# Patient Record
Sex: Female | Born: 1960
Health system: Southern US, Community
[De-identification: ages and names within clinical notes are randomized; demographics above are authoritative.]

## PROBLEM LIST (undated history)

## (undated) DIAGNOSIS — Z87442 Personal history of urinary calculi: Secondary | ICD-10-CM

## (undated) DIAGNOSIS — I5189 Other ill-defined heart diseases: Secondary | ICD-10-CM

## (undated) DIAGNOSIS — F419 Anxiety disorder, unspecified: Secondary | ICD-10-CM

## (undated) DIAGNOSIS — N200 Calculus of kidney: Secondary | ICD-10-CM

## (undated) DIAGNOSIS — G4730 Sleep apnea, unspecified: Secondary | ICD-10-CM

## (undated) DIAGNOSIS — G4733 Obstructive sleep apnea (adult) (pediatric): Secondary | ICD-10-CM

## (undated) DIAGNOSIS — G709 Myoneural disorder, unspecified: Secondary | ICD-10-CM

## (undated) DIAGNOSIS — I340 Nonrheumatic mitral (valve) insufficiency: Secondary | ICD-10-CM

## (undated) DIAGNOSIS — M1990 Unspecified osteoarthritis, unspecified site: Secondary | ICD-10-CM

## (undated) DIAGNOSIS — E079 Disorder of thyroid, unspecified: Secondary | ICD-10-CM

## (undated) DIAGNOSIS — R519 Headache, unspecified: Secondary | ICD-10-CM

## (undated) DIAGNOSIS — N1831 Chronic kidney disease, stage 3a: Secondary | ICD-10-CM

## (undated) DIAGNOSIS — K5792 Diverticulitis of intestine, part unspecified, without perforation or abscess without bleeding: Secondary | ICD-10-CM

## (undated) HISTORY — DX: Unspecified osteoarthritis, unspecified site: M19.90

## (undated) HISTORY — DX: Diverticulitis of intestine, part unspecified, without perforation or abscess without bleeding: K57.92

## (undated) HISTORY — PX: HYSTERECTOMY ABDOMINAL WITH SALPINGECTOMY: SHX6725

## (undated) HISTORY — PX: ABDOMINAL HYSTERECTOMY: SHX81

## (undated) HISTORY — DX: Anxiety disorder, unspecified: F41.9

---

## 1997-01-19 HISTORY — PX: ANAL FISSURE REPAIR: SHX2312

## 2003-12-06 ENCOUNTER — Ambulatory Visit: Payer: Self-pay | Admitting: Specialist

## 2004-01-15 ENCOUNTER — Ambulatory Visit: Payer: Self-pay | Admitting: Internal Medicine

## 2005-01-14 ENCOUNTER — Ambulatory Visit: Payer: Self-pay | Admitting: Internal Medicine

## 2005-08-13 ENCOUNTER — Ambulatory Visit: Payer: Self-pay | Admitting: Unknown Physician Specialty

## 2006-01-20 ENCOUNTER — Ambulatory Visit: Payer: Self-pay | Admitting: Internal Medicine

## 2007-09-07 ENCOUNTER — Ambulatory Visit: Payer: Self-pay | Admitting: Internal Medicine

## 2007-09-14 ENCOUNTER — Ambulatory Visit: Payer: Self-pay | Admitting: Internal Medicine

## 2008-03-16 ENCOUNTER — Ambulatory Visit: Payer: Self-pay | Admitting: Internal Medicine

## 2008-09-06 ENCOUNTER — Ambulatory Visit: Payer: Self-pay | Admitting: Internal Medicine

## 2008-10-29 ENCOUNTER — Ambulatory Visit: Payer: Self-pay | Admitting: Unknown Physician Specialty

## 2009-09-12 ENCOUNTER — Ambulatory Visit: Payer: Self-pay | Admitting: Internal Medicine

## 2010-10-08 ENCOUNTER — Ambulatory Visit: Payer: Self-pay | Admitting: Internal Medicine

## 2011-11-17 ENCOUNTER — Ambulatory Visit: Payer: Self-pay | Admitting: Family Medicine

## 2012-05-11 ENCOUNTER — Ambulatory Visit: Payer: Self-pay | Admitting: Physician Assistant

## 2012-05-31 ENCOUNTER — Ambulatory Visit: Payer: Self-pay | Admitting: General Surgery

## 2012-05-31 ENCOUNTER — Ambulatory Visit: Payer: Self-pay | Admitting: Surgery

## 2012-06-19 HISTORY — PX: BREAST BIOPSY: SHX20

## 2012-06-20 ENCOUNTER — Ambulatory Visit: Payer: Self-pay | Admitting: Surgery

## 2012-06-22 LAB — PATHOLOGY REPORT

## 2012-11-17 ENCOUNTER — Ambulatory Visit: Payer: Self-pay | Admitting: Internal Medicine

## 2012-12-08 ENCOUNTER — Emergency Department: Payer: Self-pay | Admitting: Emergency Medicine

## 2012-12-08 LAB — URINALYSIS, COMPLETE
Bilirubin,UR: NEGATIVE
Glucose,UR: NEGATIVE mg/dL (ref 0–75)
Leukocyte Esterase: NEGATIVE
Nitrite: NEGATIVE
Ph: 5 (ref 4.5–8.0)
Protein: 30
Squamous Epithelial: 5
WBC UR: 3 /HPF (ref 0–5)

## 2012-12-08 LAB — BASIC METABOLIC PANEL
Chloride: 104 mmol/L (ref 98–107)
Co2: 24 mmol/L (ref 21–32)
Creatinine: 1.16 mg/dL (ref 0.60–1.30)
EGFR (Non-African Amer.): 54 — ABNORMAL LOW
Osmolality: 282 (ref 275–301)

## 2012-12-08 LAB — CBC WITH DIFFERENTIAL/PLATELET
Basophil #: 0.1 10*3/uL (ref 0.0–0.1)
Basophil %: 0.7 %
Eosinophil #: 0.1 10*3/uL (ref 0.0–0.7)
Eosinophil %: 1.1 %
HCT: 34.2 % — ABNORMAL LOW (ref 35.0–47.0)
HGB: 12 g/dL (ref 12.0–16.0)
Lymphocyte #: 1 10*3/uL (ref 1.0–3.6)
Lymphocyte %: 12.7 %
MCH: 31.7 pg (ref 26.0–34.0)
MCHC: 35.1 g/dL (ref 32.0–36.0)
Monocyte %: 3.8 %
Neutrophil #: 6.3 10*3/uL (ref 1.4–6.5)
Platelet: 230 10*3/uL (ref 150–440)
RBC: 3.79 10*6/uL — ABNORMAL LOW (ref 3.80–5.20)
RDW: 12.5 % (ref 11.5–14.5)
WBC: 7.7 10*3/uL (ref 3.6–11.0)

## 2012-12-12 ENCOUNTER — Ambulatory Visit: Payer: Self-pay | Admitting: Urology

## 2012-12-12 DIAGNOSIS — R3989 Other symptoms and signs involving the genitourinary system: Secondary | ICD-10-CM | POA: Insufficient documentation

## 2012-12-12 DIAGNOSIS — N1330 Unspecified hydronephrosis: Secondary | ICD-10-CM | POA: Insufficient documentation

## 2012-12-12 DIAGNOSIS — N201 Calculus of ureter: Secondary | ICD-10-CM

## 2012-12-12 HISTORY — DX: Unspecified hydronephrosis: N13.30

## 2012-12-12 HISTORY — DX: Calculus of ureter: N20.1

## 2012-12-28 ENCOUNTER — Ambulatory Visit: Payer: Self-pay | Admitting: Urology

## 2013-11-20 ENCOUNTER — Ambulatory Visit: Payer: Self-pay | Admitting: Internal Medicine

## 2014-08-07 ENCOUNTER — Other Ambulatory Visit
Admission: RE | Admit: 2014-08-07 | Discharge: 2014-08-07 | Disposition: A | Discharge status: Home / Self Care | Payer: Self-pay | Source: Other Acute Inpatient Hospital | Attending: Nurse Practitioner | Admitting: Nurse Practitioner

## 2014-08-07 DIAGNOSIS — R197 Diarrhea, unspecified: Secondary | ICD-10-CM | POA: Insufficient documentation

## 2014-08-07 LAB — C DIFFICILE QUICK SCREEN W PCR REFLEX
C Diff antigen: NEGATIVE
C Diff interpretation: NEGATIVE
C Diff toxin: NEGATIVE

## 2015-02-06 ENCOUNTER — Other Ambulatory Visit: Payer: Self-pay | Admitting: Internal Medicine

## 2015-02-07 ENCOUNTER — Other Ambulatory Visit: Payer: Self-pay | Admitting: Family Medicine

## 2015-02-07 DIAGNOSIS — Z1231 Encounter for screening mammogram for malignant neoplasm of breast: Secondary | ICD-10-CM

## 2015-02-19 ENCOUNTER — Ambulatory Visit
Admission: RE | Admit: 2015-02-19 | Discharge: 2015-02-19 | Disposition: A | Discharge status: Home / Self Care | Payer: BLUE CROSS/BLUE SHIELD | Source: Ambulatory Visit | Attending: Family Medicine | Admitting: Family Medicine

## 2015-02-19 DIAGNOSIS — Z1231 Encounter for screening mammogram for malignant neoplasm of breast: Secondary | ICD-10-CM | POA: Insufficient documentation

## 2015-08-08 ENCOUNTER — Emergency Department
Admission: EM | Admit: 2015-08-08 | Discharge: 2015-08-08 | Disposition: A | Discharge status: Home / Self Care | Payer: BLUE CROSS/BLUE SHIELD | Attending: Emergency Medicine | Admitting: Emergency Medicine

## 2015-08-08 ENCOUNTER — Emergency Department: Payer: BLUE CROSS/BLUE SHIELD

## 2015-08-08 DIAGNOSIS — K5732 Diverticulitis of large intestine without perforation or abscess without bleeding: Secondary | ICD-10-CM | POA: Insufficient documentation

## 2015-08-08 DIAGNOSIS — R1031 Right lower quadrant pain: Secondary | ICD-10-CM | POA: Diagnosis present

## 2015-08-08 HISTORY — DX: Calculus of kidney: N20.0

## 2015-08-08 HISTORY — DX: Disorder of thyroid, unspecified: E07.9

## 2015-08-08 LAB — COMPREHENSIVE METABOLIC PANEL
ALBUMIN: 4.2 g/dL (ref 3.5–5.0)
ALT: 16 U/L (ref 14–54)
ANION GAP: 8 (ref 5–15)
AST: 23 U/L (ref 15–41)
Alkaline Phosphatase: 53 U/L (ref 38–126)
BILIRUBIN TOTAL: 0.8 mg/dL (ref 0.3–1.2)
BUN: 18 mg/dL (ref 6–20)
CALCIUM: 9.1 mg/dL (ref 8.9–10.3)
CO2: 29 mmol/L (ref 22–32)
CREATININE: 0.86 mg/dL (ref 0.44–1.00)
Chloride: 99 mmol/L — ABNORMAL LOW (ref 101–111)
GFR calc Af Amer: >60 mL/min (ref >60)
GFR calc non Af Amer: >60 mL/min (ref >60)
GLUCOSE: 106 mg/dL — AB (ref 65–99)
Potassium: 4 mmol/L (ref 3.5–5.1)
SODIUM: 136 mmol/L (ref 135–145)
TOTAL PROTEIN: 7.6 g/dL (ref 6.5–8.1)

## 2015-08-08 LAB — CBC
HCT: 36.2 % (ref 35.0–47.0)
Hemoglobin: 12.7 g/dL (ref 12.0–16.0)
MCH: 31.8 pg (ref 26.0–34.0)
MCHC: 35 g/dL (ref 32.0–36.0)
MCV: 90.9 fL (ref 80.0–100.0)
PLATELETS: 266 10*3/uL (ref 150–440)
RBC: 3.99 MIL/uL (ref 3.80–5.20)
RDW: 12.8 % (ref 11.5–14.5)
WBC: 12.8 10*3/uL — ABNORMAL HIGH (ref 3.6–11.0)

## 2015-08-08 LAB — URINALYSIS COMPLETE WITH MICROSCOPIC (ARMC ONLY)
BILIRUBIN URINE: NEGATIVE
GLUCOSE, UA: NEGATIVE mg/dL
HGB URINE DIPSTICK: NEGATIVE
KETONES UR: NEGATIVE mg/dL
Leukocytes, UA: NEGATIVE
NITRITE: NEGATIVE
Protein, ur: NEGATIVE mg/dL
SPECIFIC GRAVITY, URINE: 1.01 (ref 1.005–1.030)
pH: 7 (ref 5.0–8.0)

## 2015-08-08 LAB — LIPASE, BLOOD: Lipase: 19 U/L (ref 11–51)

## 2015-08-08 MED ORDER — CIPROFLOXACIN HCL 500 MG PO TABS
500.0000 mg | ORAL_TABLET | Freq: Two times a day (BID) | ORAL | Status: AC
Start: 2015-08-08 — End: 2015-08-18

## 2015-08-08 MED ORDER — IOPAMIDOL (ISOVUE-300) INJECTION 61%
100.0000 mL | Freq: Once | INTRAVENOUS | Status: AC | PRN
  Administered 2015-08-08: 100 mL via INTRAVENOUS
  Filled 2015-08-08: qty 100

## 2015-08-08 MED ORDER — METRONIDAZOLE 500 MG PO TABS: 500.0000 mg | ORAL_TABLET | Freq: Three times a day (TID) | ORAL | Status: DC

## 2015-08-08 MED ORDER — DIATRIZOATE MEGLUMINE & SODIUM 66-10 % PO SOLN
15.0000 mL | Freq: Once | ORAL | Status: AC
  Administered 2015-08-08: 15 mL via ORAL

## 2015-08-08 MED ORDER — TRAMADOL HCL 50 MG PO TABS: 50.0000 mg | ORAL_TABLET | Freq: Four times a day (QID) | ORAL | Status: AC | PRN

## 2015-08-08 NOTE — Discharge Instructions (Signed)
Diverticulitis °Diverticulitis is inflammation or infection of small pouches in your colon that form when you have a condition called diverticulosis. The pouches in your colon are called diverticula. Your colon, or large intestine, is where water is absorbed and stool is formed. °Complications of diverticulitis can include: °· Bleeding. °· Severe infection. °· Severe pain. °· Perforation of your colon. °· Obstruction of your colon. °CAUSES  °Diverticulitis is caused by bacteria. °Diverticulitis happens when stool becomes trapped in diverticula. This allows bacteria to grow in the diverticula, which can lead to inflammation and infection. °RISK FACTORS °People with diverticulosis are at risk for diverticulitis. Eating a diet that does not include enough fiber from fruits and vegetables may make diverticulitis more likely to develop. °SYMPTOMS  °Symptoms of diverticulitis may include: °· Abdominal pain and tenderness. The pain is normally located on the left side of the abdomen, but may occur in other areas. °· Fever and chills. °· Bloating. °· Cramping. °· Nausea. °· Vomiting. °· Constipation. °· Diarrhea. °· Blood in your stool. °DIAGNOSIS  °Your health care provider will ask you about your medical history and do a physical exam. You may need to have tests done because many medical conditions can cause the same symptoms as diverticulitis. Tests may include: °· Blood tests. °· Urine tests. °· Imaging tests of the abdomen, including X-rays and CT scans. °When your condition is under control, your health care provider may recommend that you have a colonoscopy. A colonoscopy can show how severe your diverticula are and whether something else is causing your symptoms. °TREATMENT  °Most cases of diverticulitis are mild and can be treated at home. Treatment may include: °· Taking over-the-counter pain medicines. °· Following a clear liquid diet. °· Taking antibiotic medicines by mouth for 7-10 days. °More severe cases may  be treated at a hospital. Treatment may include: °· Not eating or drinking. °· Taking prescription pain medicine. °· Receiving antibiotic medicines through an IV tube. °· Receiving fluids and nutrition through an IV tube. °· Surgery. °HOME CARE INSTRUCTIONS  °· Follow your health care provider's instructions carefully. °· Follow a full liquid diet or other diet as directed by your health care provider. After your symptoms improve, your health care provider may tell you to change your diet. He or she may recommend you eat a high-fiber diet. Fruits and vegetables are good sources of fiber. Fiber makes it easier to pass stool. °· Take fiber supplements or probiotics as directed by your health care provider. °· Only take medicines as directed by your health care provider. °· Keep all your follow-up appointments. °SEEK MEDICAL CARE IF:  °· Your pain does not improve. °· You have a hard time eating food. °· Your bowel movements do not return to normal. °SEEK IMMEDIATE MEDICAL CARE IF:  °· Your pain becomes worse. °· Your symptoms do not get better. °· Your symptoms suddenly get worse. °· You have a fever. °· You have repeated vomiting. °· You have bloody or black, tarry stools. °MAKE SURE YOU:  °· Understand these instructions. °· Will watch your condition. °· Will get help right away if you are not doing well or get worse. °  °This information is not intended to replace advice given to you by your health care provider. Make sure you discuss any questions you have with your health care provider. °  °Document Released: 10/15/2004 Document Revised: 01/10/2013 Document Reviewed: 11/30/2012 °Elsevier Interactive Patient Education ©2016 [employer] ° °

## 2015-08-08 NOTE — ED Notes (Signed)
MD at bedside. 

## 2015-08-08 NOTE — ED Notes (Signed)
Patient transported to CT 

## 2015-08-08 NOTE — ED Provider Notes (Signed)
[facility] Emergency Department Provider Note        Time seen: ----------------------------------------- 4:19 PM on 08/08/2015 -----------------------------------------    I have reviewed the triage vital signs and the nursing notes.   HISTORY  Chief Complaint Abdominal Pain    HPI Kathleen Sims is a 55 y.o. female who presents the ER for right lower quadrant pain that began this morning. She said some nausea and slight loose stools. She has not had this pain before. Nothing seems to make it better, laughing and moving makes it worse. She has had a history of kidney stones and this feels similar.   Past Medical History  Diagnosis Date  . Kidney stones   . Thyroid disease     There are no active problems to display for this patient.   Past Surgical History  Procedure Laterality Date  . Breast biopsy Left 06/2012    neg  . Hysterectomy abdominal with salpingectomy      Allergies Penicillins  Social History Social History  Substance Use Topics  . Smoking status: Never Smoker   . Smokeless tobacco: None  . Alcohol Use: Yes    Review of Systems Constitutional: Negative for fever. Cardiovascular: Negative for chest pain. Respiratory: Negative for shortness of breath. Gastrointestinal: Positive for abdominal pain, diarrhea Genitourinary: Negative for dysuria. Musculoskeletal: Negative for back pain. Skin: Negative for rash. Neurological: Negative for headaches, focal weakness or numbness.  10-point ROS otherwise negative.  ____________________________________________   PHYSICAL EXAM:  VITAL SIGNS: ED Triage Vitals  Enc Vitals Group     BP 08/08/15 1600 142/79 mmHg     Pulse Rate 08/08/15 1600 76     Resp 08/08/15 1600 18     Temp 08/08/15 1600 98.6 F (37 C)     Temp Source 08/08/15 1600 Oral     SpO2 08/08/15 1600 97 %     Weight 08/08/15 1600 130 lb (58.968 kg)     Height 08/08/15 1600 5\' 3"  (1.6 m)     Head Cir --      Peak Flow --      Pain Score 08/08/15 1600 4     Pain Loc --      Pain Edu? --      Excl. in GC? --     Constitutional: Alert and oriented. Well appearing and in no distress. Eyes: Conjunctivae are normal. PERRL. Normal extraocular movements. ENT   Head: Normocephalic and atraumatic.   Nose: No congestion/rhinnorhea.   Mouth/Throat: Mucous membranes are moist.   Neck: No stridor. Cardiovascular: Normal rate, regular rhythm. No murmurs, rubs, or gallops. Respiratory: Normal respiratory effort without tachypnea nor retractions. Breath sounds are clear and equal bilaterally. No wheezes/rales/rhonchi. Gastrointestinal: Soft, normal bowel sounds, right lower quadrant and McBurney's point tenderness. Also right mid quadrant tenderness Musculoskeletal: Nontender with normal range of motion in all extremities. No lower extremity tenderness nor edema. Neurologic:  Normal speech and language. No gross focal neurologic deficits are appreciated.  Skin:  Skin is warm, dry and intact. No rash noted. Psychiatric: Mood and affect are normal. Speech and behavior are normal.  ____________________________________________  ED COURSE:  Pertinent labs & imaging results that were available during my care of the patient were reviewed by me and considered in my medical decision making (see chart for details). Patient presents with right lower quadrant pain of uncertain etiology. She will need CT imaging for further evaluation. ____________________________________________    LABS (pertinent positives/negatives)  Labs Reviewed  COMPREHENSIVE  METABOLIC PANEL - Abnormal; Notable for the following:    Chloride 99 (*)    Glucose, Bld 106 (*)    All other components within normal limits  CBC - Abnormal; Notable for the following:    WBC 12.8 (*)    All other components within normal limits  URINALYSIS COMPLETEWITH MICROSCOPIC ([facility] ONLY) - Abnormal; Notable for the following:    Color, Urine  YELLOW (*)    APPearance CLEAR (*)    Bacteria, UA RARE (*)    Squamous Epithelial / LPF 0-5 (*)    All other components within normal limits  LIPASE, BLOOD    RADIOLOGY Images were viewed by me  CT of the abdomen and pelvis with contrast IMPRESSION: Focal wall thickening and stranding about a segment of the proximal transverse colon most compatible with acute diverticulitis. Colon cancer screening is recommended after the patient's acute episode has passed.  Punctate nonobstructing stone lower pole left kidney, unchanged. ____________________________________________  FINAL ASSESSMENT AND PLAN  Diverticulitis  Plan: Patient with labs and imaging as dictated above. Patient presents to ER with right-sided abdominal pain. Findings were consistent with acute diverticulitis. She'll be discharged with Cipro and Flagyl, given pain medicine and encouraged to have close follow-up with her doctor for recheck.   Emily Filbert, MD   Note: This dictation was prepared with Dragon dictation. Any transcriptional errors that result from this process are unintentional   Emily Filbert, MD 08/08/15 1714

## 2015-08-08 NOTE — ED Notes (Signed)
Pt arrives to ER via POV c/o RLQ pain that began this AM. Nausea and loose stools. Pt alert and oriented X4, active, cooperative, pt in NAD. RR even and unlabored, color WNL.

## 2015-09-10 ENCOUNTER — Other Ambulatory Visit: Payer: Self-pay | Admitting: Unknown Physician Specialty

## 2015-09-10 DIAGNOSIS — R519 Headache, unspecified: Secondary | ICD-10-CM

## 2015-09-10 DIAGNOSIS — R51 Headache: Principal | ICD-10-CM

## 2015-09-20 ENCOUNTER — Ambulatory Visit: Payer: BLUE CROSS/BLUE SHIELD

## 2016-02-18 ENCOUNTER — Other Ambulatory Visit: Payer: Self-pay | Admitting: Family Medicine

## 2016-02-18 DIAGNOSIS — Z1231 Encounter for screening mammogram for malignant neoplasm of breast: Secondary | ICD-10-CM

## 2016-03-16 ENCOUNTER — Ambulatory Visit
Admission: RE | Admit: 2016-03-16 | Discharge: 2016-03-16 | Disposition: A | Discharge status: Home / Self Care | Payer: BLUE CROSS/BLUE SHIELD | Source: Ambulatory Visit | Attending: Family Medicine | Admitting: Family Medicine

## 2016-03-16 DIAGNOSIS — Z1231 Encounter for screening mammogram for malignant neoplasm of breast: Secondary | ICD-10-CM | POA: Insufficient documentation

## 2017-01-25 ENCOUNTER — Other Ambulatory Visit: Payer: Self-pay | Admitting: Family Medicine

## 2017-01-25 DIAGNOSIS — Z1231 Encounter for screening mammogram for malignant neoplasm of breast: Secondary | ICD-10-CM

## 2017-03-17 ENCOUNTER — Ambulatory Visit
Admission: RE | Admit: 2017-03-17 | Discharge: 2017-03-17 | Disposition: A | Discharge status: Home / Self Care | Payer: BLUE CROSS/BLUE SHIELD | Source: Ambulatory Visit | Attending: Family Medicine | Admitting: Family Medicine

## 2017-03-17 DIAGNOSIS — Z1231 Encounter for screening mammogram for malignant neoplasm of breast: Secondary | ICD-10-CM | POA: Diagnosis present

## 2017-07-14 ENCOUNTER — Other Ambulatory Visit: Payer: Self-pay

## 2017-07-14 ENCOUNTER — Emergency Department: Payer: BLUE CROSS/BLUE SHIELD

## 2017-07-14 ENCOUNTER — Emergency Department
Admission: EM | Admit: 2017-07-14 | Discharge: 2017-07-14 | Disposition: A | Discharge status: Home / Self Care | Payer: BLUE CROSS/BLUE SHIELD | Attending: Emergency Medicine | Admitting: Emergency Medicine

## 2017-07-14 ENCOUNTER — Encounter: Payer: Self-pay | Admitting: Emergency Medicine

## 2017-07-14 DIAGNOSIS — N200 Calculus of kidney: Secondary | ICD-10-CM | POA: Diagnosis not present

## 2017-07-14 DIAGNOSIS — R109 Unspecified abdominal pain: Secondary | ICD-10-CM

## 2017-07-14 DIAGNOSIS — Z79899 Other long term (current) drug therapy: Secondary | ICD-10-CM | POA: Diagnosis not present

## 2017-07-14 DIAGNOSIS — N202 Calculus of kidney with calculus of ureter: Secondary | ICD-10-CM | POA: Diagnosis not present

## 2017-07-14 DIAGNOSIS — N23 Unspecified renal colic: Secondary | ICD-10-CM

## 2017-07-14 LAB — URINALYSIS, ROUTINE W REFLEX MICROSCOPIC
Bacteria, UA: NONE SEEN
Bilirubin Urine: NEGATIVE
Glucose, UA: NEGATIVE mg/dL
KETONES UR: NEGATIVE mg/dL
Leukocytes, UA: NEGATIVE
Nitrite: NEGATIVE
PH: 6 (ref 5.0–8.0)
Protein, ur: NEGATIVE mg/dL
Specific Gravity, Urine: 1.017 (ref 1.005–1.030)

## 2017-07-14 LAB — COMPREHENSIVE METABOLIC PANEL
ALK PHOS: 48 U/L (ref 38–126)
ALT: 19 U/L (ref 0–44)
AST: 29 U/L (ref 15–41)
Albumin: 3.8 g/dL (ref 3.5–5.0)
Anion gap: 10 (ref 5–15)
BILIRUBIN TOTAL: 0.5 mg/dL (ref 0.3–1.2)
BUN: 28 mg/dL — AB (ref 6–20)
CO2: 23 mmol/L (ref 22–32)
Calcium: 8.9 mg/dL (ref 8.9–10.3)
Chloride: 106 mmol/L (ref 98–111)
Creatinine, Ser: 1.01 mg/dL — ABNORMAL HIGH (ref 0.44–1.00)
GLUCOSE: 166 mg/dL — AB (ref 70–99)
Potassium: 3.6 mmol/L (ref 3.5–5.1)
Sodium: 139 mmol/L (ref 135–145)
TOTAL PROTEIN: 6.7 g/dL (ref 6.5–8.1)

## 2017-07-14 MED ORDER — ONDANSETRON HCL 4 MG/2ML IJ SOLN
INTRAMUSCULAR | Status: AC
  Filled 2017-07-14: qty 2

## 2017-07-14 MED ORDER — KETOROLAC TROMETHAMINE 30 MG/ML IJ SOLN
15.0000 mg | INTRAMUSCULAR | Status: AC
  Administered 2017-07-14: 15 mg via INTRAVENOUS

## 2017-07-14 MED ORDER — OXYCODONE HCL 5 MG PO TABS: 5.0000 mg | ORAL_TABLET | Freq: Four times a day (QID) | ORAL | 0 refills | Status: DC | PRN

## 2017-07-14 MED ORDER — KETOROLAC TROMETHAMINE 30 MG/ML IJ SOLN
INTRAMUSCULAR | Status: AC
  Filled 2017-07-14: qty 1

## 2017-07-14 MED ORDER — NAPROXEN 500 MG PO TABS: 500.0000 mg | ORAL_TABLET | Freq: Two times a day (BID) | ORAL | 0 refills | Status: DC

## 2017-07-14 MED ORDER — ONDANSETRON HCL 4 MG/2ML IJ SOLN
4.0000 mg | Freq: Once | INTRAMUSCULAR | Status: AC
  Administered 2017-07-14: 4 mg via INTRAVENOUS

## 2017-07-14 MED ORDER — ONDANSETRON 4 MG PO TBDP: 4.0000 mg | ORAL_TABLET | Freq: Three times a day (TID) | ORAL | 0 refills | Status: DC | PRN

## 2017-07-14 MED ORDER — SODIUM CHLORIDE 0.9 % IV BOLUS
1000.0000 mL | Freq: Once | INTRAVENOUS | Status: AC
Start: 2017-07-14 — End: 2017-07-14
  Administered 2017-07-14: 1000 mL via INTRAVENOUS

## 2017-07-14 MED ORDER — TAMSULOSIN HCL 0.4 MG PO CAPS: 0.4000 mg | ORAL_CAPSULE | Freq: Every day | ORAL | 0 refills | Status: DC

## 2017-07-14 MED ORDER — MORPHINE SULFATE (PF) 4 MG/ML IV SOLN
INTRAVENOUS | Status: AC
  Filled 2017-07-14: qty 1

## 2017-07-14 MED ORDER — MORPHINE SULFATE (PF) 4 MG/ML IV SOLN
4.0000 mg | Freq: Once | INTRAVENOUS | Status: AC
  Administered 2017-07-14: 4 mg via INTRAVENOUS

## 2017-07-14 NOTE — ED Notes (Signed)
Pt ambulatory to POV without difficulty. VSS. NAD. Discharge instructions, RX and follow up reviewed. All questions answered.  

## 2017-07-14 NOTE — ED Provider Notes (Addendum)
[facility] Emergency Department Provider Note  ____________________________________________  Time seen: Approximately 6:42 AM  I have reviewed the triage vital signs and the nursing notes.   HISTORY  Chief Complaint Flank Pain and Emesis    HPI Kathleen Sims is a 57 y.o. female who had sudden onset of right flank pain radiating to right lower quadrant at 5 AM today.  Constant, waxing waning, sharp.  Feels like a kidney stone like she has had in the past on her left side.  She vomited one time.  No fevers or chills.  No diarrhea.  No constipation.      Past Medical History:  Diagnosis Date  . Kidney stones   . Thyroid disease      There are no active problems to display for this patient.    Past Surgical History:  Procedure Laterality Date  . BREAST BIOPSY Left 06/2012   neg  . HYSTERECTOMY ABDOMINAL WITH SALPINGECTOMY       Prior to Admission medications   Medication Sig Start Date End Date Taking? Authorizing Provider  metroNIDAZOLE (FLAGYL) 500 MG tablet Take 1 tablet (500 mg total) by mouth 3 (three) times daily. 08/08/15   Emily Filbert, MD  naproxen (NAPROSYN) 500 MG tablet Take 1 tablet (500 mg total) by mouth 2 (two) times daily with a meal. 07/14/17   Sharman Cheek, MD  ondansetron (ZOFRAN ODT) 4 MG disintegrating tablet Take 1 tablet (4 mg total) by mouth every 8 (eight) hours as needed for nausea or vomiting. 07/14/17   Sharman Cheek, MD  oxyCODONE (ROXICODONE) 5 MG immediate release tablet Take 1 tablet (5 mg total) by mouth every 6 (six) hours as needed for breakthrough pain. 07/14/17   Sharman Cheek, MD  tamsulosin (FLOMAX) 0.4 MG CAPS capsule Take 1 capsule (0.4 mg total) by mouth daily. 07/14/17   Sharman Cheek, MD     Allergies Penicillins   Family History  Problem Relation Age of Onset  . Breast cancer Maternal Aunt        great Mat. 29's    Social History Social History   Tobacco Use  . Smoking  status: Never Smoker  . Smokeless tobacco: Never Used  Substance Use Topics  . Alcohol use: Yes  . Drug use: Never    Review of Systems  Constitutional:   No fever or chills.  ENT:   No sore throat. No rhinorrhea. Cardiovascular:   No chest pain or syncope. Respiratory:   No dyspnea or cough. Gastrointestinal:   Positive as above right flank pain and vomiting. Musculoskeletal:   Negative for focal pain or swelling All other systems reviewed and are negative except as documented above in ROS and HPI.  ____________________________________________   PHYSICAL EXAM:  VITAL SIGNS: ED Triage Vitals  Enc Vitals Group     BP 07/14/17 0548 130/82     Pulse Rate 07/14/17 0548 (!) 59     Resp 07/14/17 0548 18     Temp 07/14/17 0548 98 F (36.7 C)     Temp Source 07/14/17 0548 Oral     SpO2 07/14/17 0548 100 %     Weight 07/14/17 0549 128 lb (58.1 kg)     Height 07/14/17 0549 5\' 3"  (1.6 m)     Head Circumference --      Peak Flow --      Pain Score 07/14/17 0549 10     Pain Loc --      Pain Edu? --  Excl. in GC? --     Vital signs reviewed, nursing assessments reviewed.   Constitutional:   Alert and oriented. Non-toxic appearance. Eyes:   Conjunctivae are normal. EOMI. PERRL. ENT      Head:   Normocephalic and atraumatic.      Nose:   No congestion/rhinnorhea.       Mouth/Throat:   MMM, no pharyngeal erythema. No peritonsillar mass.       Neck:   No meningismus. Full ROM. Hematological/Lymphatic/Immunilogical:   No cervical lymphadenopathy. Cardiovascular:   RRR. Symmetric bilateral radial and DP pulses.  No murmurs.  Respiratory:   Normal respiratory effort without tachypnea/retractions. Breath sounds are clear and equal bilaterally. No wheezes/rales/rhonchi. Gastrointestinal:   Soft with right lower quadrant tenderness . Non distended. There is no CVA tenderness.  No rebound, rigidity, or guarding.  Musculoskeletal:   Normal range of motion in all extremities. No  joint effusions.  No lower extremity tenderness.  No edema. Neurologic:   Normal speech and language.  Motor grossly intact. No acute focal neurologic deficits are appreciated.  Skin:    Skin is warm, dry and intact. No rash noted.  No petechiae, purpura, or bullae.  ____________________________________________    LABS (pertinent positives/negatives) (all labs ordered are listed, but only abnormal results are displayed) Labs Reviewed  COMPREHENSIVE METABOLIC PANEL - Abnormal; Notable for the following components:      Result Value   Glucose, Bld 166 (*)    BUN 28 (*)    Creatinine, Ser 1.01 (*)    All other components within normal limits  URINALYSIS, ROUTINE W REFLEX MICROSCOPIC   ____________________________________________   EKG    ____________________________________________    RADIOLOGY  Ct Renal Stone Study  Result Date: 07/14/2017 CLINICAL DATA:  Right flank pain EXAM: CT ABDOMEN AND PELVIS WITHOUT CONTRAST TECHNIQUE: Multidetector CT imaging of the abdomen and pelvis was performed following the standard protocol without IV contrast. COMPARISON:  08/08/2015 FINDINGS: Lower chest: Minimal dependent atelectasis. Hepatobiliary: Unremarkable Pancreas: Unremarkable Spleen: Unremarkable Adrenals/Urinary Tract: Adrenal glands are unremarkable. Mild right hydronephrosis. 4 mm calculus at the right ureteropelvic junction. No renal calculus. No evidence of left urinary calculus. Small benign-appearing cyst in the lower pole of the left kidney is stable. Decompressed bladder. Stomach/Bowel: Normal appendix. Moderate stool burden throughout the colon. No obvious mass in the colon. No evidence of small-bowel obstruction. Stomach is decompressed. Vascular/Lymphatic: No evidence of aortic aneurysm. No obvious abnormal retroperitoneal adenopathy. Reproductive: Uterus is absent.  Adnexa are unremarkable. Other: No free fluid. Musculoskeletal: No vertebral compression deformity. Advanced  L5-S1 degenerative disc disease with vacuum. Mild L4-5 and L5-S1 facet arthropathy. IMPRESSION: 4 mm right ureteropelvic junction calculus is associated with mild right hydronephrosis. Electronically Signed   By: Jolaine ClickArthur  Hoss M.D.   On: 07/14/2017 07:36    ____________________________________________   PROCEDURES Procedures  ____________________________________________  DIFFERENTIAL DIAGNOSIS   Ureteral colic, appendicitis, ovarian cyst  CLINICAL IMPRESSION / ASSESSMENT AND PLAN / ED COURSE  Pertinent labs & imaging results that were available during my care of the patient were reviewed by me and considered in my medical decision making (see chart for details).    Patient uncomfortable but not in distress.  Vital signs unremarkable.  Presents with right flank pain, most likely ureteral colic, but given the location I will obtain a CT scan for further evaluation.  Check chemistry panel and urinalysis.  For symptom control, patient was given Zofran 4 mg IV, morphine 4 mill grams IV, Toradol 15 mg  IV for her severe pain and nausea.  I ordered 1 L IV fluid bolus for hydration.   ----------------------------------------- 7:20 AM on 07/14/2017 -----------------------------------------  CT scan shows a 4 mm kidney stone on the right proximal ureter with mild to moderate hydronephrosis.  We will follow-up on urinalysis and plan for discharge home.  Add Flomax.  Symptoms  remain controlled.     ____________________________________________   FINAL CLINICAL IMPRESSION(S) / ED DIAGNOSES    Final diagnoses:  Ureteral colic  Flank pain  Kidney stone     ED Discharge Orders        Ordered    naproxen (NAPROSYN) 500 MG tablet  2 times daily with meals     07/14/17 0720    oxyCODONE (ROXICODONE) 5 MG immediate release tablet  Every 6 hours PRN     07/14/17 0720    ondansetron (ZOFRAN ODT) 4 MG disintegrating tablet  Every 8 hours PRN     07/14/17 0720    tamsulosin (FLOMAX) 0.4 MG  CAPS capsule  Daily     07/14/17 0750      Portions of this note were generated with dragon dictation software. Dictation errors may occur despite best attempts at proofreading.    Sharman Cheek, MD 07/14/17 [id number]    Sharman Cheek, MD 07/14/17 [phone]

## 2017-07-14 NOTE — ED Triage Notes (Signed)
Pt arrived to the ED accompanied by her husband for complaints of right side flank pain. Pt states that she had kidney stones in the past and that this feels just like it. Pt is AOx4 in moderate pain distress.

## 2017-07-14 NOTE — Discharge Instructions (Signed)
Your CT scan shows a 3mm kidney stone on your right side.  It is just outside the kidney, so it may take a few days for the stone to pass before your symptoms resolve.  Take naproxen twice a day for the next 5 days. Take zofran as needed for nausea, and oxycodone when needed for severe pain.

## 2018-02-15 ENCOUNTER — Other Ambulatory Visit: Payer: Self-pay | Admitting: Family Medicine

## 2018-02-15 ENCOUNTER — Encounter: Payer: Self-pay | Admitting: General Practice

## 2018-02-15 DIAGNOSIS — Z1231 Encounter for screening mammogram for malignant neoplasm of breast: Secondary | ICD-10-CM

## 2018-03-18 ENCOUNTER — Ambulatory Visit
Admission: RE | Admit: 2018-03-18 | Discharge: 2018-03-18 | Disposition: A | Discharge status: Home / Self Care | Payer: BLUE CROSS/BLUE SHIELD | Source: Ambulatory Visit | Attending: Family Medicine | Admitting: Family Medicine

## 2018-03-18 DIAGNOSIS — Z1231 Encounter for screening mammogram for malignant neoplasm of breast: Secondary | ICD-10-CM | POA: Insufficient documentation

## 2018-09-01 DIAGNOSIS — D2339 Other benign neoplasm of skin of other parts of face: Secondary | ICD-10-CM | POA: Diagnosis not present

## 2018-09-01 DIAGNOSIS — L821 Other seborrheic keratosis: Secondary | ICD-10-CM | POA: Diagnosis not present

## 2018-09-01 DIAGNOSIS — L738 Other specified follicular disorders: Secondary | ICD-10-CM | POA: Diagnosis not present

## 2018-11-29 DIAGNOSIS — R1030 Lower abdominal pain, unspecified: Secondary | ICD-10-CM | POA: Diagnosis not present

## 2018-11-29 DIAGNOSIS — R197 Diarrhea, unspecified: Secondary | ICD-10-CM | POA: Diagnosis not present

## 2018-11-29 DIAGNOSIS — K625 Hemorrhage of anus and rectum: Secondary | ICD-10-CM | POA: Diagnosis not present

## 2018-11-29 DIAGNOSIS — Z86010 Personal history of colonic polyps: Secondary | ICD-10-CM | POA: Diagnosis not present

## 2019-02-02 ENCOUNTER — Other Ambulatory Visit: Payer: Self-pay | Admitting: Family Medicine

## 2019-02-02 DIAGNOSIS — Z1231 Encounter for screening mammogram for malignant neoplasm of breast: Secondary | ICD-10-CM

## 2019-02-10 DIAGNOSIS — J00 Acute nasopharyngitis [common cold]: Secondary | ICD-10-CM | POA: Diagnosis not present

## 2019-02-13 ENCOUNTER — Other Ambulatory Visit: Payer: Self-pay | Admitting: Family Medicine

## 2019-02-13 DIAGNOSIS — N6320 Unspecified lump in the left breast, unspecified quadrant: Secondary | ICD-10-CM

## 2019-02-14 ENCOUNTER — Encounter: Payer: Self-pay | Admitting: General Practice

## 2019-02-14 DIAGNOSIS — Z0189 Encounter for other specified special examinations: Secondary | ICD-10-CM | POA: Diagnosis not present

## 2019-02-15 DIAGNOSIS — Z043 Encounter for examination and observation following other accident: Secondary | ICD-10-CM | POA: Diagnosis not present

## 2019-02-15 DIAGNOSIS — Z713 Dietary counseling and surveillance: Secondary | ICD-10-CM | POA: Diagnosis not present

## 2019-02-23 DIAGNOSIS — J069 Acute upper respiratory infection, unspecified: Secondary | ICD-10-CM | POA: Diagnosis not present

## 2019-02-28 ENCOUNTER — Other Ambulatory Visit: Payer: BLUE CROSS/BLUE SHIELD

## 2019-03-06 ENCOUNTER — Other Ambulatory Visit: Payer: BLUE CROSS/BLUE SHIELD

## 2019-03-14 ENCOUNTER — Ambulatory Visit
Admission: RE | Admit: 2019-03-14 | Discharge: 2019-03-14 | Disposition: A | Discharge status: Home / Self Care | Payer: BC Managed Care – PPO | Source: Ambulatory Visit | Attending: Family Medicine | Admitting: Family Medicine

## 2019-03-14 DIAGNOSIS — R922 Inconclusive mammogram: Secondary | ICD-10-CM | POA: Diagnosis not present

## 2019-03-14 DIAGNOSIS — N6489 Other specified disorders of breast: Secondary | ICD-10-CM | POA: Diagnosis not present

## 2019-03-14 DIAGNOSIS — N6320 Unspecified lump in the left breast, unspecified quadrant: Secondary | ICD-10-CM | POA: Insufficient documentation

## 2019-04-04 DIAGNOSIS — Z01812 Encounter for preprocedural laboratory examination: Secondary | ICD-10-CM | POA: Diagnosis not present

## 2019-04-07 DIAGNOSIS — Z86010 Personal history of colonic polyps: Secondary | ICD-10-CM | POA: Diagnosis not present

## 2019-04-07 DIAGNOSIS — K640 First degree hemorrhoids: Secondary | ICD-10-CM | POA: Diagnosis not present

## 2019-04-07 DIAGNOSIS — K5730 Diverticulosis of large intestine without perforation or abscess without bleeding: Secondary | ICD-10-CM | POA: Diagnosis not present

## 2019-04-18 DIAGNOSIS — N951 Menopausal and female climacteric states: Secondary | ICD-10-CM | POA: Diagnosis not present

## 2019-04-24 DIAGNOSIS — N951 Menopausal and female climacteric states: Secondary | ICD-10-CM | POA: Diagnosis not present

## 2019-05-26 ENCOUNTER — Other Ambulatory Visit: Payer: Self-pay

## 2019-05-30 ENCOUNTER — Ambulatory Visit: Payer: BC Managed Care – PPO | Admitting: Nurse Practitioner

## 2019-05-30 ENCOUNTER — Other Ambulatory Visit: Payer: Self-pay

## 2019-05-30 ENCOUNTER — Encounter: Payer: Self-pay | Admitting: Nurse Practitioner

## 2019-05-30 VITALS — BP 118/80 | HR 66 | Temp 97.1°F | Ht 63.0 in | Wt 128.8 lb

## 2019-05-30 DIAGNOSIS — E039 Hypothyroidism, unspecified: Secondary | ICD-10-CM | POA: Diagnosis not present

## 2019-05-30 DIAGNOSIS — N200 Calculus of kidney: Secondary | ICD-10-CM

## 2019-05-30 DIAGNOSIS — Z8719 Personal history of other diseases of the digestive system: Secondary | ICD-10-CM

## 2019-05-30 DIAGNOSIS — R232 Flushing: Secondary | ICD-10-CM | POA: Insufficient documentation

## 2019-05-30 DIAGNOSIS — Z0000 Encounter for general adult medical examination without abnormal findings: Secondary | ICD-10-CM

## 2019-05-30 DIAGNOSIS — F4323 Adjustment disorder with mixed anxiety and depressed mood: Secondary | ICD-10-CM

## 2019-05-30 DIAGNOSIS — K5900 Constipation, unspecified: Secondary | ICD-10-CM

## 2019-05-30 DIAGNOSIS — K582 Mixed irritable bowel syndrome: Secondary | ICD-10-CM

## 2019-05-30 DIAGNOSIS — Z87442 Personal history of urinary calculi: Secondary | ICD-10-CM

## 2019-05-30 DIAGNOSIS — Z86010 Personal history of colonic polyps: Secondary | ICD-10-CM

## 2019-05-30 DIAGNOSIS — E785 Hyperlipidemia, unspecified: Secondary | ICD-10-CM

## 2019-05-30 DIAGNOSIS — M47812 Spondylosis without myelopathy or radiculopathy, cervical region: Secondary | ICD-10-CM

## 2019-05-30 DIAGNOSIS — Z87448 Personal history of other diseases of urinary system: Secondary | ICD-10-CM

## 2019-05-30 HISTORY — DX: Hypothyroidism, unspecified: E03.9

## 2019-05-30 NOTE — Patient Instructions (Addendum)
It was really nice to see you again today.  I have given you laboratory orders to get fasting blood work.  Continue work with constipation with MiraLAX and stool softener.  I placed a GYN referral in for you to discuss rashes and estrogen titration and options.  You have elevated cholesterol and LDL panels.  Continue to work on diet and exercise.  I will check fasting labs now.  We can repeat again in 3 months and see where you.

## 2019-05-30 NOTE — Progress Notes (Signed)
New Patient Office Visit  Subjective:  Patient ID: Kathleen Sims, female    DOB: Nov 10, 1960  Age: 59 y.o. MRN: [id number]  CC:  Chief Complaint  Patient presents with  . New Patient (Initial Visit)    establish care    HPI Kathleen Sims is a 59 year old who presents to establish care with primary care provider. She had a rough year and had Covid at the end of Jan and she has  had both vaccines. She had the loss of her sister and mother this year. She reports strong family support and she is handling the grief well enough.   Hot flashes: She would like help with hot flashes. She had a total hysterectomy in 1999. She was taking premarin until last year. She stopped it and had terrible hot flashes -10 times a day and wakes her up at night . She is on a tiny dose of estrogen at 0.5 mg and wants to know if there are other options for her. She has no sex drive and would like help with this.    Hypothyroidism: On Synthroid 125 mg daily. No concerns.  Neck arthritis- On Mobic 15 mg and gets HA with if she misses more than 3 days. Tylenol does not help. No neck stretching  exercises.   IBS with constipation predominant currently: On Miralax and stool softeners. Does not like to take the Miralax often as it makes small stools. She has not tried Linzess. She is followed by [facility]. Recent colonoscopy .   Hx of nephrolithiasis and hydronephrosis.  She has a prescription for Flomax and oxycodone to take if she develops another kidney stone. Last time she needed this medication was about 2 years ago.   She drinks plenty of fluids and stays well-hydrated and has done well.    Immunizations: Covid 04/25/2019 and 05/16/2019 Diet:healthy Exercise: yes Colonoscopy:UTD and due in 5 years Dexa: 2011 She is on calcium 1000 mg per day with vit D  No other additional D 3 now as she gets in the sun.  Pap Smear: Complete hysterectomy Mammogram: 03/14/2019  Wine x 1 on weekend. Non smoker.   Past Medical  History:  Diagnosis Date  . Arthritis   . Diverticulitis   . History of diverticulitis 05/31/2019  . History of hydronephrosis 05/31/2019  . History of nephrolithiasis 05/31/2019  . Hypothyroid 05/30/2019  . IBS (irritable bowel syndrome) 05/31/2019  . Kidney stones   . Nephrolithiasis 05/31/2019  . Thyroid disease     Past Surgical History:  Procedure Laterality Date  . ABDOMINAL HYSTERECTOMY    . ANAL FISSURE REPAIR  1999   with sphincterotomy  . BREAST BIOPSY Left 06/2012   neg  . HYSTERECTOMY ABDOMINAL WITH SALPINGECTOMY      Family History  Problem Relation Age of Onset  . Breast cancer Maternal Aunt        great Mat. 80's  . Arthritis Mother   . Diabetes Mother   . Hearing loss Mother   . Hyperlipidemia Mother   . Hypertension Mother   . Stroke Sister   . Parkinson's disease Sister   . Hypertension Sister   . Early death Sister   . Diabetes Sister   . Heart disease Brother   . Cystic fibrosis Brother   . Arthritis Sister   . Asthma Sister   . Depression Sister   . Hyperlipidemia Sister   . Hypertension Sister   . Hypertension Sister   . Hyperlipidemia  Sister   . Arthritis Sister   . Arthritis Sister   . Depression Sister   . Hypertension Sister   . Multiple sclerosis Sister     Social History   Socioeconomic History  . Marital status: Married    Spouse name: Not on file  . Number of children: Not on file  . Years of education: Not on file  . Highest education level: Not on file  Occupational History  . Not on file  Tobacco Use  . Smoking status: Never Smoker  . Smokeless tobacco: Never Used  Substance and Sexual Activity  . Alcohol use: Yes  . Drug use: Never  . Sexual activity: Yes  Other Topics Concern  . Not on file  Social History Narrative  . Not on file   Social Determinants of Health   Financial Resource Strain:   . Difficulty of Paying Living Expenses:   Food Insecurity:   . Worried About Charity fundraiser in the Last Year:     . Arboriculturist in the Last Year:   Transportation Needs:   . Film/video editor (Medical):   Marland Kitchen Lack of Transportation (Non-Medical):   Physical Activity:   . Days of Exercise per Week:   . Minutes of Exercise per Session:   Stress:   . Feeling of Stress :   Social Connections:   . Frequency of Communication with Friends and Family:   . Frequency of Social Gatherings with Friends and Family:   . Attends Religious Services:   . Active Member of Clubs or Organizations:   . Attends Archivist Meetings:   Marland Kitchen Marital Status:   Intimate Partner Violence:   . Fear of Current or Ex-Partner:   . Emotionally Abused:   Marland Kitchen Physically Abused:   . Sexually Abused:     ROS Review of Systems  Constitutional: Positive for fatigue.       Stress and tired,  Back to normal from Covid.   HENT: Negative for congestion and sore throat.   Eyes: Negative.   Respiratory: Negative for cough and shortness of breath.   Cardiovascular: Negative for chest pain, palpitations and leg swelling.  Gastrointestinal: Positive for constipation.  Genitourinary: Negative for difficulty urinating, dysuria, frequency and hematuria.  Skin: Negative for rash.  Neurological: Negative for dizziness and light-headedness.  Hematological: Negative for adenopathy. Does not bruise/bleed easily.  Psychiatric/Behavioral:       Recent grief and loss- has access to employee health behavioral health. She declines as doing well enough.     Objective:   Today's Vitals: BP 118/80 (BP Location: Left Arm, Patient Position: Sitting, Cuff Size: Small)   Pulse 66   Temp (!) 97.1 F (36.2 C) (Skin)   Ht 5' 3" (1.6 m)   Wt 128 lb 12.8 oz (58.4 kg)   SpO2 96%   BMI 22.82 kg/m   Physical Exam Vitals reviewed.  Constitutional:      Appearance: Normal appearance. She is normal weight.  HENT:     Head: Normocephalic and atraumatic.  Eyes:     Conjunctiva/sclera: Conjunctivae normal.     Pupils: Pupils are equal,  round, and reactive to light.  Cardiovascular:     Rate and Rhythm: Normal rate and regular rhythm.     Pulses: Normal pulses.     Heart sounds: Normal heart sounds.  Pulmonary:     Effort: Pulmonary effort is normal.     Breath sounds: Normal breath sounds.  Abdominal:  General: Abdomen is flat.     Palpations: Abdomen is soft.     Tenderness: There is no abdominal tenderness.  Musculoskeletal:        General: No swelling. Normal range of motion.     Cervical back: Normal range of motion and neck supple.  Skin:    General: Skin is warm and dry.  Neurological:     General: No focal deficit present.     Mental Status: She is alert and oriented to person, place, and time.  Psychiatric:        Mood and Affect: Mood normal.        Behavior: Behavior normal.        Thought Content: Thought content normal.        Judgment: Judgment normal.     Assessment & Plan:   Problem List Items Addressed This Visit      Cardiovascular and Mediastinum   Hot flashes - Primary   Relevant Orders   Ambulatory referral to Obstetrics / Gynecology     Digestive   IBS (irritable bowel syndrome)   Relevant Medications   polyethylene glycol powder (GLYCOLAX/MIRALAX) 17 GM/SCOOP powder     Endocrine   Hypothyroid   Relevant Medications   levothyroxine (SYNTHROID) 125 MCG tablet   Other Relevant Orders   TSH     Genitourinary   RESOLVED: Nephrolithiasis     Other   History of nephrolithiasis   History of hydronephrosis   Hx of adenomatous colonic polyps   History of diverticulitis    Other Visit Diagnoses    Adjustment reaction with anxiety and depression       Constipation, unspecified constipation type       Relevant Orders   CBC with Differential/Platelet   Hyperlipidemia, unspecified hyperlipidemia type       Relevant Orders   Comp Met (CMET)   Lipid Profile   Encounter for medical examination to establish care          Outpatient Encounter Medications as of 05/30/2019    Medication Sig  . Cholecalciferol 50 MCG (2000 UT) CAPS Take by mouth.  . cyclobenzaprine (FLEXERIL) 10 MG tablet cyclobenzaprine 10 mg tablet  TAKE 1 TABLET BY MOUTH 3 TIMES A DAY  . estradiol (ESTRACE) 0.5 MG tablet Take 0.5 mg by mouth daily.  Marland Kitchen levothyroxine (SYNTHROID) 125 MCG tablet Synthroid 125 mcg tablet  TAKE 1 TABLET (125 MCG) BY ORAL ROUTE ONCE DAILY  . Melatonin 5 MG SUBL Place under the tongue.  . meloxicam (MOBIC) 15 MG tablet Take by mouth.  . ondansetron (ZOFRAN ODT) 4 MG disintegrating tablet Take 1 tablet (4 mg total) by mouth every 8 (eight) hours as needed for nausea or vomiting.  Marland Kitchen oxyCODONE (ROXICODONE) 5 MG immediate release tablet Take 1 tablet (5 mg total) by mouth every 6 (six) hours as needed for breakthrough pain.  . polyethylene glycol powder (GLYCOLAX/MIRALAX) 17 GM/SCOOP powder polyethylene glycol 3350 17 gram/dose oral powder  MIX AND DISSOLVE IN FLUID AND DRINK AT ONCE AS DIRECTED  . tamsulosin (FLOMAX) 0.4 MG CAPS capsule Take 1 capsule (0.4 mg total) by mouth daily.  . [DISCONTINUED] metroNIDAZOLE (FLAGYL) 500 MG tablet Take 1 tablet (500 mg total) by mouth 3 (three) times daily.  . [DISCONTINUED] naproxen (NAPROSYN) 500 MG tablet Take 1 tablet (500 mg total) by mouth 2 (two) times daily with a meal.   No facility-administered encounter medications on file as of 05/30/2019.   I have given you laboratory orders  to get fasting blood work.  Continue work with constipation with MiraLAX and stool softener.  I placed a GYN referral in for you to discuss rashes and estrogen titration and options.  You have elevated cholesterol and LDL panels.  Continue to work on diet and exercise.  I will check fasting labs now.  We can repeat again in 3 months and see where you.   Follow-up: Return in about 3 months (around 08/30/2019).  This visit occurred during the SARS-CoV-2 public health emergency.  Safety protocols were in place, including screening questions prior to  the visit, additional usage of staff PPE, and extensive cleaning of exam room while observing appropriate contact time as indicated for disinfecting solutions.   Denice Paradise, NP

## 2019-05-31 ENCOUNTER — Encounter: Payer: Self-pay | Admitting: Nurse Practitioner

## 2019-05-31 DIAGNOSIS — Z87442 Personal history of urinary calculi: Secondary | ICD-10-CM | POA: Insufficient documentation

## 2019-05-31 DIAGNOSIS — Z87448 Personal history of other diseases of urinary system: Secondary | ICD-10-CM | POA: Insufficient documentation

## 2019-05-31 DIAGNOSIS — K589 Irritable bowel syndrome without diarrhea: Secondary | ICD-10-CM

## 2019-05-31 DIAGNOSIS — M47812 Spondylosis without myelopathy or radiculopathy, cervical region: Secondary | ICD-10-CM

## 2019-05-31 DIAGNOSIS — N200 Calculus of kidney: Secondary | ICD-10-CM

## 2019-05-31 DIAGNOSIS — Z8719 Personal history of other diseases of the digestive system: Secondary | ICD-10-CM | POA: Insufficient documentation

## 2019-05-31 DIAGNOSIS — Z86010 Personal history of colonic polyps: Secondary | ICD-10-CM | POA: Insufficient documentation

## 2019-05-31 HISTORY — DX: Personal history of other diseases of urinary system: Z87.448

## 2019-05-31 HISTORY — DX: Calculus of kidney: N20.0

## 2019-05-31 HISTORY — DX: Personal history of urinary calculi: Z87.442

## 2019-05-31 HISTORY — DX: Personal history of other diseases of the digestive system: Z87.19

## 2019-05-31 HISTORY — DX: Irritable bowel syndrome, unspecified: K58.9

## 2019-05-31 HISTORY — DX: Spondylosis without myelopathy or radiculopathy, cervical region: M47.812

## 2019-06-25 ENCOUNTER — Telehealth: Payer: Self-pay | Admitting: Nurse Practitioner

## 2019-06-25 NOTE — Telephone Encounter (Signed)
I have given her laboratory orders to get fasting blood work. She is a no show for labs ordered in May.  Please call her and advise that she get these labs done at her convenience.  I wanted them as scheduled is fasting.  Thank you

## 2019-06-27 ENCOUNTER — Encounter: Payer: Self-pay | Admitting: Obstetrics and Gynecology

## 2019-06-27 ENCOUNTER — Telehealth: Payer: Self-pay | Admitting: Nurse Practitioner

## 2019-06-27 ENCOUNTER — Other Ambulatory Visit: Payer: Self-pay

## 2019-06-27 ENCOUNTER — Ambulatory Visit: Payer: BC Managed Care – PPO | Admitting: Obstetrics and Gynecology

## 2019-06-27 VITALS — BP 145/82 | HR 63 | Ht 63.0 in | Wt 129.3 lb

## 2019-06-27 DIAGNOSIS — R030 Elevated blood-pressure reading, without diagnosis of hypertension: Secondary | ICD-10-CM

## 2019-06-27 DIAGNOSIS — Z9229 Personal history of other drug therapy: Secondary | ICD-10-CM | POA: Diagnosis not present

## 2019-06-27 DIAGNOSIS — N951 Menopausal and female climacteric states: Secondary | ICD-10-CM

## 2019-06-27 DIAGNOSIS — M1711 Unilateral primary osteoarthritis, right knee: Secondary | ICD-10-CM | POA: Diagnosis not present

## 2019-06-27 MED ORDER — EST ESTROGENS-METHYLTEST 0.625-1.25 MG PO TABS: 1.0000 | ORAL_TABLET | Freq: Every day | ORAL | 3 refills | Status: DC

## 2019-06-27 NOTE — Telephone Encounter (Signed)
Pt returned call and stated that she will schedule labs in July

## 2019-06-27 NOTE — Telephone Encounter (Signed)
LMTCB to schedule fasting lab appt

## 2019-06-27 NOTE — Progress Notes (Signed)
GYNECOLOGY PROGRESS NOTE  Subjective:    Patient ID: Kathleen Sims, female    DOB: [birth year], 59 y.o.   MRN: [id number]  HPI  Patient is a 59 y.o. G0P0000 female who presents as a referral from her PCP [facility]) due to complaints of menopausal vasomotor symptoms.  She has a history of HRT use (Premarin tablets) for ~ 20 years (initiated after her hysterectomy).  Patient states that last year, her insurance changed and would no longer cover her medication.  Patient opted to discontinue use at that time, and was able to go ~ 8 months when her vasomotor symptoms intensified.  She was seen by her PCP ~ 4 months ago, who initiated her on Estrace (generic) which was covered by her insurance, however she states that she has not experienced much relief.  She is still having hot flushes and night sweats during the night (at least 2-3 times each night), as well as flashes during the day.  Was advised to seek care with a GYN for further discussion of management options.   Of note, she is also noting some issues with decreased libido.  Notes that her husband uses testosterone supplementation, and has somewhat of an increased sex drive.  Hot flahses (up 2-3 times per night).     Gynecologic History:  Last pap smear:  Last mammogram: 03/14/2019  Past Medical History:  Diagnosis Date  . Arthritis   . Diverticulitis   . History of diverticulitis 05/31/2019  . History of hydronephrosis 05/31/2019  . History of nephrolithiasis 05/31/2019  . Hypothyroid 05/30/2019  . IBS (irritable bowel syndrome) 05/31/2019  . Kidney stones   . Neck arthritis 05/31/2019  . Nephrolithiasis 05/31/2019  . Thyroid disease     Family History  Problem Relation Age of Onset  . Breast cancer Maternal Aunt        great Mat. 80's  . Arthritis Mother   . Diabetes Mother   . Hearing loss Mother   . Hyperlipidemia Mother   . Hypertension Mother   . Stroke Sister   . Parkinson's disease Sister   . Hypertension  Sister   . Early death Sister   . Diabetes Sister   . Heart disease Brother   . Cystic fibrosis Brother   . Arthritis Sister   . Asthma Sister   . Depression Sister   . Hyperlipidemia Sister   . Hypertension Sister   . Hypertension Sister   . Hyperlipidemia Sister   . Arthritis Sister   . Arthritis Sister   . Depression Sister   . Hypertension Sister   . Multiple sclerosis Sister     OB History  Gravida Para Term Preterm AB Living  0 0 0 0 0 0  SAB TAB Ectopic Multiple Live Births  0 0 0 0 0    Social History   Socioeconomic History  . Marital status: Married    Spouse name: Not on file  . Number of children: Not on file  . Years of education: Not on file  . Highest education level: Not on file  Occupational History  . Not on file  Tobacco Use  . Smoking status: Never Smoker  . Smokeless tobacco: Never Used  Vaping Use  . Vaping Use: Never used  Substance and Sexual Activity  . Alcohol use: Yes  . Drug use: Never  . Sexual activity: Not Currently  Other Topics Concern  . Not on file  Social History Narrative  . Not  on file   Social Determinants of Health   Financial Resource Strain:   . Difficulty of Paying Living Expenses:   Food Insecurity:   . Worried About Programme researcher, broadcasting/film/video in the Last Year:   . Barista in the Last Year:   Transportation Needs:   . Freight forwarder (Medical):   Marland Kitchen Lack of Transportation (Non-Medical):   Physical Activity:   . Days of Exercise per Week:   . Minutes of Exercise per Session:   Stress:   . Feeling of Stress :   Social Connections:   . Frequency of Communication with Friends and Family:   . Frequency of Social Gatherings with Friends and Family:   . Attends Religious Services:   . Active Member of Clubs or Organizations:   . Attends Banker Meetings:   Marland Kitchen Marital Status:   Intimate Partner Violence:   . Fear of Current or Ex-Partner:   . Emotionally Abused:   Marland Kitchen Physically Abused:     . Sexually Abused:     Current Outpatient Medications on File Prior to Visit  Medication Sig Dispense Refill  . Cholecalciferol 50 MCG (2000 UT) CAPS Take by mouth.    . levothyroxine (SYNTHROID) 125 MCG tablet Synthroid 125 mcg tablet  TAKE 1 TABLET (125 MCG) BY ORAL ROUTE ONCE DAILY    . Melatonin 5 MG SUBL Place under the tongue.    . meloxicam (MOBIC) 15 MG tablet Take by mouth.    . ondansetron (ZOFRAN ODT) 4 MG disintegrating tablet Take 1 tablet (4 mg total) by mouth every 8 (eight) hours as needed for nausea or vomiting. 20 tablet 0  . oxyCODONE (ROXICODONE) 5 MG immediate release tablet Take 1 tablet (5 mg total) by mouth every 6 (six) hours as needed for breakthrough pain. 12 tablet 0  . polyethylene glycol powder (GLYCOLAX/MIRALAX) 17 GM/SCOOP powder polyethylene glycol 3350 17 gram/dose oral powder  MIX AND DISSOLVE IN FLUID AND DRINK AT ONCE AS DIRECTED    . Probiotic Product (PROBIOTIC DAILY PO) Take by mouth.    . tamsulosin (FLOMAX) 0.4 MG CAPS capsule Take 1 capsule (0.4 mg total) by mouth daily. 30 capsule 0   No current facility-administered medications on file prior to visit.    Allergies  Allergen Reactions  . Amoxicillin     Other reaction(s): Unknown  . Lansoprazole     Other reaction(s): Other (See Comments) Intolerance  . Penicillins      Review of Systems Pertinent items noted in HPI and remainder of comprehensive ROS otherwise negative.   Objective:   Blood pressure (!) 145/82, pulse 63, height 5\' 3"  (1.6 m), weight 129 lb 4.8 oz (58.7 kg). General appearance: alert and no distress Remainder of exam deferred.   Assessment:   Menopausal vasomotory symptoms History of HRT use Elevated blood pressure  Plan:   - Discussion had with patient regarding symptoms and options. Will check with insurance company to assess which medications are covered by insurance.  Can prescribe Estratest.  - Patient is aware of her long-term use of hormonal therapy  and risks associated with it.  Also discussed other non-hormonal options that could help her vasomotor symptoms (however may not have an affect on her sex drive). Desires to resume HRT.  - Patient with elevated BP today, no prior h/o HTN. Advised to monitor BPs at future visits.  - Patient to f/u in 1-2 months if symptoms do not improve.  Rubie Maid, MD [facility]

## 2019-06-27 NOTE — Progress Notes (Signed)
New Pt present for referral for hot flashes and night sweat. Pt stated that she was taking medication for hot flashes and stopped during the pandemic.

## 2019-06-27 NOTE — Patient Instructions (Signed)
Menopause and Hormone Replacement Therapy Menopause is a normal time of life when menstrual periods stop completely and the ovaries stop producing the female hormones estrogen and progesterone. This lack of hormones can affect your health and cause undesirable symptoms. Hormone replacement therapy (HRT) can relieve some of those symptoms. What is hormone replacement therapy? HRT is the use of artificial (synthetic) hormones to replace hormones that your body has stopped producing because you have reached menopause. What are my options for HRT?  HRT may consist of the synthetic hormones estrogen and progestin, or it may consist of only estrogen (estrogen-only therapy). You and your health care provider will decide which form of HRT is best for you. If you choose to be on HRT and you have a uterus, estrogen and progestin are usually prescribed. Estrogen-only therapy is used for women who do not have a uterus. Possible options for taking HRT include:  Pills.  Patches.  Gels.  Sprays.  Vaginal cream.  Vaginal rings.  Vaginal inserts. The amount of hormone(s) that you take and how long you take the hormone(s) varies according to your health. It is important to:  Begin HRT with the lowest possible dosage.  Stop HRT as soon as your health care provider tells you to stop.  Work with your health care provider so that you feel informed and comfortable with your decisions. What are the benefits of HRT? HRT can reduce the frequency and severity of menopausal symptoms. Benefits of HRT vary according to the kind of symptoms that you have, how severe they are, and your overall health. HRT may help to improve the following symptoms of menopause:  Hot flashes and night sweats. These are sudden feelings of heat that spread over the face and body. The skin may turn red, like a blush. Night sweats are hot flashes that happen while you are sleeping or trying to sleep.  Bone loss (osteoporosis). The  body loses calcium more quickly after menopause, causing the bones to become weaker. This can increase the risk for bone breaks (fractures).  Vaginal dryness. The lining of the vagina can become thin and dry, which can cause pain during sex or cause infection, burning, or itching.  Urinary tract infections.  Urinary incontinence. This is the inability to control when you pass urine.  Irritability.  Short-term memory problems. What are the risks of HRT? Risks of HRT vary depending on your individual health and medical history. Risks of HRT also depend on whether you receive both estrogen and progestin or you receive estrogen only. HRT may increase the risk of:  Spotting. This is when a small amount of blood leaks from the vagina unexpectedly.  Endometrial cancer. This cancer is in the lining of the uterus (endometrium).  Breast cancer.  Increased density of breast tissue. This can make it harder to find breast cancer on a breast X-ray (mammogram).  Stroke.  Heart disease.  Blood clots.  Gallbladder disease.  Liver disease. Risks of HRT can increase if you have any of the following conditions:  Endometrial cancer.  Liver disease.  Heart disease.  Breast cancer.  History of blood clots.  History of stroke. Follow these instructions at home:  Take over-the-counter and prescription medicines only as told by your health care provider.  Get mammograms, pelvic exams, and medical checkups as often as told by your health care provider.  Have Pap tests done as often as told by your health care provider. A Pap test is sometimes called a Pap smear. It   is a screening test that is used to check for signs of cancer of the cervix and vagina. A Pap test can also identify the presence of infection or precancerous changes. Pap tests may be done: ? Every 3 years, starting at age 21. ? Every 5 years, starting after age 30, in combination with testing for human papillomavirus  (HPV). ? More often or less often depending on other medical conditions you have, your age, and other risk factors.  It is up to you to get the results of your Pap test. Ask your health care provider, or the department that is doing the test, when your results will be ready.  Keep all follow-up visits as told by your health care provider. This is important. Contact a health care provider if you have:  Pain or swelling in your legs.  Shortness of breath.  Chest pain.  Lumps or changes in your breasts or armpits.  Slurred speech.  Pain, burning, or bleeding when you urinate.  Unusual vaginal bleeding.  Dizziness or headaches.  Weakness or numbness in any part of your arms or legs.  Pain in your abdomen. Summary  Menopause is a normal time of life when menstrual periods stop completely and the ovaries stop producing the female hormones estrogen and progesterone.  Hormone replacement therapy (HRT) can relieve some of the symptoms of menopause.  HRT can reduce the frequency and severity of menopausal symptoms.  Risks of HRT vary depending on your individual health and medical history. This information is not intended to replace advice given to you by your health care provider. Make sure you discuss any questions you have with your health care provider. Document Revised: 09/07/2017 Document Reviewed: 09/07/2017 Elsevier Patient Education  2020 Elsevier Inc.  

## 2019-06-27 NOTE — Telephone Encounter (Signed)
Spoke with patient and she will have labs drawn next month at [facility]

## 2019-06-27 NOTE — Telephone Encounter (Signed)
Spoke with patient and she will have her labs done at [facility] in july

## 2019-08-22 DIAGNOSIS — Z0189 Encounter for other specified special examinations: Secondary | ICD-10-CM | POA: Diagnosis not present

## 2019-08-22 LAB — HEPATIC FUNCTION PANEL
ALT: 14 (ref 7–35)
AST: 20 (ref 13–35)
Alkaline Phosphatase: 72 (ref 25–125)
Bilirubin, Total: 0.5

## 2019-08-22 LAB — CBC AND DIFFERENTIAL
HCT: 39 (ref 36–46)
Hemoglobin: 13.1 (ref 12.0–16.0)
Neutrophils Absolute: 3
Platelets: 294 (ref 150–399)
WBC: 5

## 2019-08-22 LAB — CBC: RBC: 4.19 (ref 3.87–5.11)

## 2019-08-22 LAB — LIPID PANEL
Cholesterol: 245 — AB (ref 0–200)
HDL: 71 — AB (ref 35–70)
LDL Cholesterol: 164
Triglycerides: 61 (ref 40–160)

## 2019-08-22 LAB — BASIC METABOLIC PANEL
BUN: 26 — AB (ref 4–21)
CO2: 26 — AB (ref 13–22)
Chloride: 102 (ref 99–108)
Creatinine: 1.1 (ref <=1.1)
Glucose: 86
Potassium: 4.6 (ref 3.4–5.3)
Sodium: 140 (ref 137–147)

## 2019-08-22 LAB — COMPREHENSIVE METABOLIC PANEL
Albumin: 4.6 (ref 3.5–5.0)
Calcium: 9.5 (ref 8.7–10.7)
GFR calc non Af Amer: 52
Globulin: 2.3

## 2019-08-22 LAB — TSH: TSH: 0.94 (ref <=5.90)

## 2019-08-24 ENCOUNTER — Telehealth: Payer: Self-pay | Admitting: Nurse Practitioner

## 2019-08-24 NOTE — Telephone Encounter (Addendum)
I reviewed her lab work obtained 08/22/2019 and as she does have an elevated creatinine 1.14, normal BUN 26, CO2 26 with normal range 13-22, cholesterol 245 LDL 164, normal thyroid, liver function, electrolytes, urine CBC, glucose.  PLAN: 1. Ask her to hydrate well and we can repeat Met B at her  f/up office visit.   2.  Continue with healthy low-fat diet, exercise.

## 2019-08-25 NOTE — Telephone Encounter (Signed)
Patient notified and voiced understanding to results and will stay hydrated and see you on the 11Th.

## 2019-08-28 ENCOUNTER — Other Ambulatory Visit: Payer: Self-pay

## 2019-08-30 ENCOUNTER — Other Ambulatory Visit: Payer: Self-pay

## 2019-08-30 ENCOUNTER — Encounter: Payer: Self-pay | Admitting: Nurse Practitioner

## 2019-08-30 ENCOUNTER — Telehealth: Payer: Self-pay | Admitting: Nurse Practitioner

## 2019-08-30 ENCOUNTER — Encounter: Payer: BC Managed Care – PPO | Admitting: Nurse Practitioner

## 2019-08-30 NOTE — Progress Notes (Signed)
Erroneous visit

## 2019-08-30 NOTE — Telephone Encounter (Signed)
She came in today for a follow up visit to recheck her BUN. She gets her labs drawn at work. She has no concerns.   She had to leave for an appt and could not wait for the provider. I spoke with Kathleen Sims on the telephone.  She reports she is active in spin class and gets dehydrated. Her BUN fluctuates with hydration status- for years. She will hydrate well and her pattern is the BUN returns to normal. She has normal GFR and creatinine. Hx of Kidney stones and she says she is due to f/up with Dr.  Evelene Croon. She reports she will contact his office. She has no kidney stone symptoms now.   She is working on her lipids- healthy lifestyle. She is going through menopause and thinks her lipids will improve. She  declines starting on a statin. Reports repeat CPE labs are due in Jan and her lipids will be rechecked. She will forward me a copy and make a follow-up visit in January.

## 2019-09-04 DIAGNOSIS — Z1152 Encounter for screening for COVID-19: Secondary | ICD-10-CM | POA: Diagnosis not present

## 2019-09-11 ENCOUNTER — Encounter: Payer: Self-pay | Admitting: Nurse Practitioner

## 2019-09-11 DIAGNOSIS — R310 Gross hematuria: Secondary | ICD-10-CM | POA: Diagnosis not present

## 2019-09-11 DIAGNOSIS — N23 Unspecified renal colic: Secondary | ICD-10-CM | POA: Diagnosis not present

## 2019-09-11 DIAGNOSIS — Z0189 Encounter for other specified special examinations: Secondary | ICD-10-CM | POA: Diagnosis not present

## 2019-09-11 DIAGNOSIS — N200 Calculus of kidney: Secondary | ICD-10-CM | POA: Diagnosis not present

## 2019-09-20 DIAGNOSIS — N200 Calculus of kidney: Secondary | ICD-10-CM | POA: Diagnosis not present

## 2019-09-21 DIAGNOSIS — N390 Urinary tract infection, site not specified: Secondary | ICD-10-CM | POA: Diagnosis not present

## 2019-09-21 DIAGNOSIS — R3129 Other microscopic hematuria: Secondary | ICD-10-CM | POA: Diagnosis not present

## 2019-09-21 DIAGNOSIS — N23 Unspecified renal colic: Secondary | ICD-10-CM | POA: Diagnosis not present

## 2019-09-27 ENCOUNTER — Other Ambulatory Visit: Payer: Self-pay

## 2019-09-27 ENCOUNTER — Other Ambulatory Visit
Admission: RE | Admit: 2019-09-27 | Discharge: 2019-09-27 | Disposition: A | Discharge status: Home / Self Care | Payer: BC Managed Care – PPO | Source: Ambulatory Visit | Attending: Urology | Admitting: Urology

## 2019-09-27 DIAGNOSIS — Z20822 Contact with and (suspected) exposure to covid-19: Secondary | ICD-10-CM | POA: Diagnosis not present

## 2019-09-27 DIAGNOSIS — Z01812 Encounter for preprocedural laboratory examination: Secondary | ICD-10-CM | POA: Diagnosis not present

## 2019-09-27 DIAGNOSIS — R3129 Other microscopic hematuria: Secondary | ICD-10-CM | POA: Diagnosis not present

## 2019-09-27 DIAGNOSIS — N201 Calculus of ureter: Secondary | ICD-10-CM | POA: Diagnosis not present

## 2019-09-27 DIAGNOSIS — R310 Gross hematuria: Secondary | ICD-10-CM | POA: Diagnosis not present

## 2019-09-27 LAB — SARS CORONAVIRUS 2 (TAT 6-24 HRS): SARS Coronavirus 2: NEGATIVE

## 2019-09-27 NOTE — H&P (Signed)
NAME: Kathleen Sims, Kathleen Sims MEDICAL RECORD KZ:[id number] ACCOUNT [id number] DATE OF BIRTH:Mar 14, 1960 FACILITY: [facility] LOCATION: [facility] PHYSICIAN:Zalika Tieszen Gilles Chiquito, MD  HISTORY AND PHYSICAL  DATE OF ADMISSION:  09/28/2019  CHIEF COMPLAINT:  Left flank pain.  HISTORY OF PRESENT ILLNESS:  The patient is a 59 year old Caucasian female with a 67-month history of intermittent left flank pain.  She has had 1 episode of hematuria since that time.  She was found to have probable distal left ureteral calculus with  slight hydronephrosis on IVP.  She comes in now for left retrograde with left ureteroscopic ureterolithotomy with holmium laser lithotripsy.  She does have a past history of calcium oxalate stone disease.  ALLERGIES:  PENICILLIN.  CURRENT MEDICATIONS:  Synthroid, meloxicam, calcium, stool softeners, probiotics, omega 3 fish oil, Nucynta, Zofran and tamsulosin.  PAST SURGICAL HISTORY: 1.  Hysterectomy 2000. 2.  Anal fissure repair in 1996.  PAST AND CURRENT MEDICAL HISTORY: 1.  Hypothyroidism. 2.  Arthritis of the cervical spine. 3.  Irritable bowel syndrome. 4.  Kidney stones.  REVIEW OF SYSTEMS:  The patient denies heart disease, diabetes, stroke or hypertension.  She denied chest pain.  SOCIAL HISTORY:  The patient denied tobacco use.  She consumes alcohol rarely.  FAMILY HISTORY:  Father died at age 48 of heart disease.  Mother died at age 36 with diabetes and hypercholesterolemia.  The patient has a sister who died with Parkinson disease.  There is no family history of urological disease.  PHYSICAL EXAMINATION: VITAL SIGNS:  Height 5 feet, weight 129 pounds. GENERAL:  Well-nourished, white female in no acute distress. HEENT:  Sclerae were clear.  Pupils are equally round, reactive to light and accommodation.  Extraocular movements are intact. NECK:  No palpable cervical adenopathy. PULMONARY:  Lungs clear to auscultation. CARDIOVASCULAR:  Regular rhythm and rate without  audible murmurs. ABDOMEN:  Soft, nontender abdomen. GENITOURINARY AND RECTAL:  Deferred. NEUROMUSCULAR:  Alert and oriented x3.  IMPRESSION: 1.  Left ureterolithiasis with renal colic. 2.  Hematuria.  PLAN:  Left retrograde with left ureteroscopic ureterolithotomy and holmium laser lithotripsy with possible stent placement.  VN/NUANCE  D:09/27/2019 T:09/27/2019 JOB:[id number]

## 2019-09-28 ENCOUNTER — Ambulatory Visit: Payer: BC Managed Care – PPO | Admitting: Anesthesiology

## 2019-09-28 ENCOUNTER — Other Ambulatory Visit: Payer: Self-pay

## 2019-09-28 ENCOUNTER — Ambulatory Visit: Payer: BC Managed Care – PPO

## 2019-09-28 ENCOUNTER — Ambulatory Visit
Admission: RE | Admit: 2019-09-28 | Discharge: 2019-09-28 | Disposition: A | Discharge status: Home / Self Care | Payer: BC Managed Care – PPO | Attending: Urology | Admitting: Urology

## 2019-09-28 ENCOUNTER — Encounter
Admission: RE | Disposition: A | Discharge status: Home / Self Care | Payer: Self-pay | Source: Home / Self Care | Attending: Urology

## 2019-09-28 ENCOUNTER — Encounter: Payer: Self-pay | Admitting: Urology

## 2019-09-28 DIAGNOSIS — Z820 Family history of epilepsy and other diseases of the nervous system: Secondary | ICD-10-CM | POA: Diagnosis not present

## 2019-09-28 DIAGNOSIS — K589 Irritable bowel syndrome without diarrhea: Secondary | ICD-10-CM | POA: Insufficient documentation

## 2019-09-28 DIAGNOSIS — E039 Hypothyroidism, unspecified: Secondary | ICD-10-CM | POA: Diagnosis not present

## 2019-09-28 DIAGNOSIS — Z833 Family history of diabetes mellitus: Secondary | ICD-10-CM | POA: Insufficient documentation

## 2019-09-28 DIAGNOSIS — Z8249 Family history of ischemic heart disease and other diseases of the circulatory system: Secondary | ICD-10-CM | POA: Insufficient documentation

## 2019-09-28 DIAGNOSIS — Z8349 Family history of other endocrine, nutritional and metabolic diseases: Secondary | ICD-10-CM | POA: Insufficient documentation

## 2019-09-28 DIAGNOSIS — M1990 Unspecified osteoarthritis, unspecified site: Secondary | ICD-10-CM | POA: Insufficient documentation

## 2019-09-28 DIAGNOSIS — N132 Hydronephrosis with renal and ureteral calculous obstruction: Secondary | ICD-10-CM | POA: Insufficient documentation

## 2019-09-28 DIAGNOSIS — Z79899 Other long term (current) drug therapy: Secondary | ICD-10-CM | POA: Insufficient documentation

## 2019-09-28 DIAGNOSIS — N200 Calculus of kidney: Secondary | ICD-10-CM | POA: Diagnosis not present

## 2019-09-28 DIAGNOSIS — Z791 Long term (current) use of non-steroidal anti-inflammatories (NSAID): Secondary | ICD-10-CM | POA: Diagnosis not present

## 2019-09-28 DIAGNOSIS — Z880 Allergy status to penicillin: Secondary | ICD-10-CM | POA: Insufficient documentation

## 2019-09-28 DIAGNOSIS — Z90710 Acquired absence of both cervix and uterus: Secondary | ICD-10-CM | POA: Insufficient documentation

## 2019-09-28 DIAGNOSIS — N201 Calculus of ureter: Secondary | ICD-10-CM

## 2019-09-28 HISTORY — PX: URETEROSCOPY WITH HOLMIUM LASER LITHOTRIPSY: SHX6645

## 2019-09-28 SURGERY — URETEROSCOPY, WITH LITHOTRIPSY USING HOLMIUM LASER
Anesthesia: General | Laterality: Left

## 2019-09-28 MED ORDER — FENTANYL CITRATE (PF) 100 MCG/2ML IJ SOLN
INTRAMUSCULAR | Status: DC | PRN
Start: 2019-09-28 — End: 2019-09-28
  Administered 2019-09-28: 100 ug via INTRAVENOUS

## 2019-09-28 MED ORDER — OXYCODONE HCL 5 MG/5ML PO SOLN: 5.0000 mg | Freq: Once | ORAL | Status: DC | PRN

## 2019-09-28 MED ORDER — FENTANYL CITRATE (PF) 100 MCG/2ML IJ SOLN
INTRAMUSCULAR | Status: AC
  Filled 2019-09-28: qty 2

## 2019-09-28 MED ORDER — LIDOCAINE HCL (CARDIAC) PF 100 MG/5ML IV SOSY
PREFILLED_SYRINGE | INTRAVENOUS | Status: DC | PRN
  Administered 2019-09-28: 60 mg via INTRAVENOUS

## 2019-09-28 MED ORDER — ORAL CARE MOUTH RINSE: 15.0000 mL | Freq: Once | OROMUCOSAL | Status: AC

## 2019-09-28 MED ORDER — HYDROCODONE-ACETAMINOPHEN 10-325 MG PO TABS: 1.0000 | ORAL_TABLET | ORAL | 0 refills | Status: DC | PRN

## 2019-09-28 MED ORDER — LIDOCAINE HCL (PF) 2 % IJ SOLN
INTRAMUSCULAR | Status: AC
  Filled 2019-09-28: qty 5

## 2019-09-28 MED ORDER — LACTATED RINGERS IV SOLN: INTRAVENOUS | Status: DC

## 2019-09-28 MED ORDER — PROPOFOL 10 MG/ML IV BOLUS
INTRAVENOUS | Status: DC | PRN
  Administered 2019-09-28: 140 mg via INTRAVENOUS

## 2019-09-28 MED ORDER — BELLADONNA ALKALOIDS-OPIUM 16.2-60 MG RE SUPP
RECTAL | Status: AC
  Filled 2019-09-28: qty 1

## 2019-09-28 MED ORDER — CHLORHEXIDINE GLUCONATE 0.12 % MT SOLN: 15.0000 mL | Freq: Once | OROMUCOSAL | Status: AC

## 2019-09-28 MED ORDER — ONDANSETRON HCL 4 MG/2ML IJ SOLN
INTRAMUSCULAR | Status: AC
  Filled 2019-09-28: qty 2

## 2019-09-28 MED ORDER — PROPOFOL 10 MG/ML IV BOLUS
INTRAVENOUS | Status: AC
  Filled 2019-09-28: qty 20

## 2019-09-28 MED ORDER — ONDANSETRON HCL 4 MG PO TABS: 4.0000 mg | ORAL_TABLET | Freq: Three times a day (TID) | ORAL | 2 refills | Status: DC | PRN

## 2019-09-28 MED ORDER — ROCURONIUM BROMIDE 100 MG/10ML IV SOLN
INTRAVENOUS | Status: DC | PRN
  Administered 2019-09-28: 35 mg via INTRAVENOUS

## 2019-09-28 MED ORDER — CHLORHEXIDINE GLUCONATE 0.12 % MT SOLN
OROMUCOSAL | Status: AC
  Administered 2019-09-28: 15 mL via OROMUCOSAL
  Filled 2019-09-28: qty 15

## 2019-09-28 MED ORDER — LIDOCAINE HCL URETHRAL/MUCOSAL 2 % EX GEL
CUTANEOUS | Status: DC | PRN
  Administered 2019-09-28: 1

## 2019-09-28 MED ORDER — SUGAMMADEX SODIUM 200 MG/2ML IV SOLN
INTRAVENOUS | Status: DC | PRN
  Administered 2019-09-28: 200 mg via INTRAVENOUS

## 2019-09-28 MED ORDER — EPHEDRINE SULFATE 50 MG/ML IJ SOLN
INTRAMUSCULAR | Status: DC | PRN
  Administered 2019-09-28: 5 mg via INTRAVENOUS

## 2019-09-28 MED ORDER — IOHEXOL 180 MG/ML  SOLN
INTRAMUSCULAR | Status: DC | PRN
  Administered 2019-09-28: 10 mL

## 2019-09-28 MED ORDER — DEXAMETHASONE SODIUM PHOSPHATE 10 MG/ML IJ SOLN
INTRAMUSCULAR | Status: AC
  Filled 2019-09-28: qty 1

## 2019-09-28 MED ORDER — LEVOFLOXACIN IN D5W 500 MG/100ML IV SOLN
INTRAVENOUS | Status: AC
  Filled 2019-09-28: qty 100

## 2019-09-28 MED ORDER — OXYCODONE HCL 5 MG PO TABS: 5.0000 mg | ORAL_TABLET | Freq: Once | ORAL | Status: DC | PRN

## 2019-09-28 MED ORDER — FENTANYL CITRATE (PF) 100 MCG/2ML IJ SOLN: 25.0000 ug | INTRAMUSCULAR | Status: DC | PRN

## 2019-09-28 MED ORDER — LIDOCAINE HCL URETHRAL/MUCOSAL 2 % EX GEL
CUTANEOUS | Status: AC
  Filled 2019-09-28: qty 10

## 2019-09-28 MED ORDER — PHENYLEPHRINE HCL (PRESSORS) 10 MG/ML IV SOLN
INTRAVENOUS | Status: AC
  Filled 2019-09-28: qty 1

## 2019-09-28 MED ORDER — PHENYLEPHRINE HCL (PRESSORS) 10 MG/ML IV SOLN
INTRAVENOUS | Status: DC | PRN
  Administered 2019-09-28 (×3): 100 ug via INTRAVENOUS

## 2019-09-28 MED ORDER — MIDAZOLAM HCL 2 MG/2ML IJ SOLN
INTRAMUSCULAR | Status: AC
  Filled 2019-09-28: qty 2

## 2019-09-28 MED ORDER — TAMSULOSIN HCL 0.4 MG PO CAPS: 0.4000 mg | ORAL_CAPSULE | Freq: Every day | ORAL | 0 refills | Status: DC

## 2019-09-28 MED ORDER — MIDAZOLAM HCL 2 MG/2ML IJ SOLN
INTRAMUSCULAR | Status: DC | PRN
  Administered 2019-09-28: 2 mg via INTRAVENOUS

## 2019-09-28 MED ORDER — PROMETHAZINE HCL 25 MG/ML IJ SOLN: 6.2500 mg | INTRAMUSCULAR | Status: DC | PRN

## 2019-09-28 MED ORDER — BELLADONNA ALKALOIDS-OPIUM 16.2-60 MG RE SUPP
RECTAL | Status: DC | PRN
Start: 2019-09-28 — End: 2019-09-28
  Administered 2019-09-28: 1 via RECTAL

## 2019-09-28 MED ORDER — LEVOFLOXACIN IN D5W 500 MG/100ML IV SOLN
500.0000 mg | Freq: Once | INTRAVENOUS | Status: AC
  Administered 2019-09-28: 500 mg via INTRAVENOUS

## 2019-09-28 MED ORDER — CIPROFLOXACIN HCL 500 MG PO TABS: 500.0000 mg | ORAL_TABLET | Freq: Two times a day (BID) | ORAL | 0 refills | Status: DC

## 2019-09-28 SURGICAL SUPPLY — 25 items
BAG DRAIN CYSTO-URO LG1000N (MISCELLANEOUS) ×3 IMPLANT
BRUSH SCRUB EZ 1% IODOPHOR (MISCELLANEOUS) ×3 IMPLANT
CATH URETL 5X70 OPEN END (CATHETERS) ×3 IMPLANT
CATH URETL OPEN END 6X70 (CATHETERS) ×2 IMPLANT
CNTNR SPEC 2.5X3XGRAD LEK (MISCELLANEOUS)
CONT SPEC 4OZ STER OR WHT (MISCELLANEOUS)
CONT SPEC 4OZ STRL OR WHT (MISCELLANEOUS)
CONTAINER SPEC 2.5X3XGRAD LEK (MISCELLANEOUS) IMPLANT
COVER WAND RF STERILE (DRAPES) ×3 IMPLANT
FIBER LASER FLEXIVA 365 (UROLOGICAL SUPPLIES) ×2 IMPLANT
GLOVE BIO SURGEON STRL SZ7.5 (GLOVE) ×3 IMPLANT
GOWN STRL REUS W/ TWL LRG LVL3 (GOWN DISPOSABLE) ×1 IMPLANT
GOWN STRL REUS W/ TWL XL LVL3 (GOWN DISPOSABLE) ×1 IMPLANT
GOWN STRL REUS W/TWL LRG LVL3 (GOWN DISPOSABLE) ×3
GOWN STRL REUS W/TWL XL LVL3 (GOWN DISPOSABLE) ×3
GUIDEWIRE STR ZIPWIRE 035X150 (MISCELLANEOUS) ×3 IMPLANT
KIT TURNOVER CYSTO (KITS) ×3 IMPLANT
PACK CYSTO AR (MISCELLANEOUS) ×3 IMPLANT
SET CYSTO W/LG BORE CLAMP LF (SET/KITS/TRAYS/PACK) ×3 IMPLANT
SOL .9 NS 3000ML IRR  AL (IV SOLUTION) ×2
SOL .9 NS 3000ML IRR AL (IV SOLUTION) ×1
SOL .9 NS 3000ML IRR UROMATIC (IV SOLUTION) ×1 IMPLANT
SURGILUBE 2OZ TUBE FLIPTOP (MISCELLANEOUS) ×3 IMPLANT
SYR 10ML LL (SYRINGE) ×3 IMPLANT
WATER STERILE IRR 1000ML POUR (IV SOLUTION) ×3 IMPLANT

## 2019-09-28 NOTE — Transfer of Care (Signed)
Immediate Anesthesia Transfer of Care Note  Patient: Kathleen Sims  Procedure(s) Performed: URETEROSCOPY WITH HOLMIUM LASER LITHOTRIPSY (Left )  Patient Location: PACU  Anesthesia Type:General  Level of Consciousness: awake, alert  and oriented  Airway & Oxygen Therapy: Patient Spontanous Breathing  Post-op Assessment: Report given to RN and Post -op Vital signs reviewed and stable  Post vital signs: Reviewed and stable  Last Vitals:  Vitals Value Taken Time  BP    Temp    Pulse    Resp    SpO2      Last Pain:  Vitals:   09/28/19 0754  TempSrc: Oral  PainSc: 0-No pain         Complications: No complications documented.

## 2019-09-28 NOTE — Anesthesia Postprocedure Evaluation (Signed)
Anesthesia Post Note  Patient: Kathleen Sims  Procedure(s) Performed: URETEROSCOPY WITH HOLMIUM LASER LITHOTRIPSY (Left )  Patient location during evaluation: PACU Anesthesia Type: General Level of consciousness: awake and alert Pain management: pain level controlled Vital Signs Assessment: post-procedure vital signs reviewed and stable Respiratory status: spontaneous breathing and respiratory function stable Cardiovascular status: stable Anesthetic complications: no   No complications documented.   Last Vitals:  Vitals:   09/28/19 1059 09/28/19 1144  BP:  109/74  Pulse:  61  Resp:  12  Temp: (!) 36.1 C 36.5 C  SpO2:  100%    Last Pain:  Vitals:   09/28/19 1144  TempSrc:   PainSc: 0-No pain                 Omri Bertran K

## 2019-09-28 NOTE — Anesthesia Procedure Notes (Signed)
Procedure Name: Intubation Date/Time: 09/28/2019 10:06 AM Performed by: Clyde Lundborg, CRNA Pre-anesthesia Checklist: Patient identified, Emergency Drugs available, Suction available and Patient being monitored Patient Re-evaluated:Patient Re-evaluated prior to induction Oxygen Delivery Method: Circle system utilized Preoxygenation: Pre-oxygenation with 100% oxygen Induction Type: IV induction Ventilation: Mask ventilation without difficulty Laryngoscope Size: McGraph and 3 Tube type: Oral Tube size: 7.0 mm Number of attempts: 1 Airway Equipment and Method: Video-laryngoscopy Placement Confirmation: ETT inserted through vocal cords under direct vision,  positive ETCO2,  breath sounds checked- equal and bilateral and CO2 detector Secured at: 20 cm Tube secured with: Tape Dental Injury: Teeth and Oropharynx as per pre-operative assessment

## 2019-09-28 NOTE — Op Note (Signed)
Preoperative diagnosis: 1.  Left ureterolithiasis (N20.1)                                             2.  Left hydronephrosis (N13.2)  Postoperative diagnosis: Same  Procedure: 1.  Left ureteroscopic ureterolithotomy with holmium lithotripsy (CPT 52356)                      2.  Left retrograde pyelography (CPT 52005)  Surgeon: Suszanne Conners. Evelene Croon MD  Anesthesia: General  Indications:See the history and physical. After informed consent the above procedure(s) were requested     Technique and findings: After adequate general anesthesia been obtained the patient was placed into dorsal lithotomy position and the perineum was prepped and draped in the usual fashion.  Fluoroscopy was performed and definite stone could not be identified due to overlying bowel gas.  The 21 French cystoscope was then coupled to the camera and visually advanced into the bladder.  The bladder was thoroughly inspected.  No bladder tumors were identified.  A 6 French open-ended catheter was then placed into the left ureteral orifice and retrograde pyelography performed.  A 7 mm stone was localized in the distal left ureter with proximal hydronephrosis.  The cystoscope was then removed and the short semirigid mini ureteroscope was coupled to the camera and visually advanced up to the stone.  The 365 m holmium laser fiber was advanced up to the stone.  The power was set at 0.5 J with frequency of 5 and the stone was then fully disintegrated.  The ureteroscope was then passed all the way up to the left UPJ and no additional lesions were identified.  The ureteroscope was then removed.  10 cc of viscous Xylocaine was instilled within the urethra and bladder.  A B&O suppository was placed.  The procedure was then terminated and patient transferred to the recovery room in stable condition.

## 2019-09-28 NOTE — H&P (Signed)
Date of Initial H&P: 09/27/19  History reviewed, patient examined, no change in status, stable for surgery.

## 2019-09-28 NOTE — Discharge Instructions (Addendum)
AMBULATORY SURGERY  DISCHARGE INSTRUCTIONS   1) The drugs that you were given will stay in your system until tomorrow so for the next 24 hours you should not:  A) Drive an automobile B) Make any legal decisions C) Drink any alcoholic beverage   2) You may resume regular meals tomorrow.  Today it is better to start with liquids and gradually work up to solid foods.  You may eat anything you prefer, but it is better to start with liquids, then soup and crackers, and gradually work up to solid foods.   3) Please notify your doctor immediately if you have any unusual bleeding, trouble breathing, redness and pain at the surgery site, drainage, fever, or pain not relieved by medication.    4) Additional Instructions:        Please contact your physician with any problems or Same Day Surgery at [phone], Monday through Friday 6 am to 4 pm, or [place] [facility] number at [phone].     Laser Therapy for Kidney Stones, Care After This sheet gives you information about how to care for yourself after your procedure. Your health care provider may also give you more specific instructions. If you have problems or questions, contact your health care provider. What can I expect after the procedure? After the procedure, it is common to have:  Pain.  A burning sensation while urinating.  Small amounts of blood in your urine.  A need to urinate frequently.  Pieces of kidney stone in your urine.  Mild discomfort when urinating that may be felt in the back. You may experience this if you have a flexible tube (stent) in your ureter. Follow these instructions at home:  Medicines  Take over-the-counter and prescription medicines only as told by your health care provider.  If you were prescribed an antibiotic medicine, take it as told by your health care provider. Do not stop taking the antibiotic even if you start to feel better.  Ask your health care provider if  the medicine prescribed to you: ? Requires you to avoid driving or using heavy machinery. ? Can cause constipation. You may need to take actions to prevent or treat constipation, such as:  Take over-the-counter or prescription medicines.  Eat foods that are high in fiber, such as beans, whole grains, and fresh fruits and vegetables.  Limit foods that are high in fat and processed sugars, such as fried or sweet foods. Activity  Return to your normal activities as told by your health care provider. Ask your health care provider what activities are safe for you.  Do not drive for 24 hours if you were given a sedative during your procedure. General instructions  If your health care provider approves, you may take a warm bath to ease discomfort and burning.  Drink enough fluid to keep your urine pale yellow. Your health care provider may recommend drinking two 8 oz (237 mL) glasses of water per hour for a few hours after your procedure.  You may be asked to strain your urine to collect any stone fragments that you pass. These fragments may be tested.  Keep all follow-up visits as told by your health care provider. This is important. If you have a stent, you will need to return to your health care provider to have the stent removed. Contact a health care provider if you:  Have pain or a burning feeling that lasts more than 2 days.  Feel nauseous.  Vomit more and more  often.  Have difficulty urinating.  Have pain that gets worse or does not get better with medicine. Get help right away if:  You are unable to urinate, even if your bladder feels full.  You have: ? Bright red blood or blood clots in your urine. ? More blood in your urine. ? Severe pain or discomfort. ? A fever or shaking chills. ? Abdominal pain. ? Difficulty breathing. ? Swelling in your legs. Summary  After the procedure, it is common to have a burning sensation while urinating and small amounts of blood in  your urine.  Take over-the-counter and prescription medicines only as told by your health care provider.  Drink enough fluid to keep your urine pale yellow.  Keep all follow-up visits as told by your health care provider. This is important. This information is not intended to replace advice given to you by your health care provider. Make sure you discuss any questions you have with your health care provider. Document Revised: 09/16/2017 Document Reviewed: 09/16/2017 Elsevier Patient Education  2020 [employer]. Ureteroscopy Ureteroscopy is a procedure to check for and treat problems inside part of the urinary tract. In this procedure, a thin, tube-shaped instrument with a light at the end (ureteroscope) is used to look at the inside of the kidneys and the ureters, which are the tubes that carry urine from the kidneys to the bladder. The ureteroscope is inserted into one or both of the ureters. You may need this procedure if you have frequent urinary tract infections (UTIs), blood in your urine, or a stone in one of your ureters. A ureteroscopy can be done to find the cause of urine blockage in a ureter and to evaluate other abnormalities inside the ureters or kidneys. If stones are found, they can be removed during the procedure. Polyps, abnormal tissue, and some types of tumors can also be removed or treated. The ureteroscope may also have a tool to remove tissue to be checked for disease under a microscope (biopsy). Tell a health care provider about:  Any allergies you have.  All medicines you are taking, including vitamins, herbs, eye drops, creams, and over-the-counter medicines.  Any problems you or family members have had with anesthetic medicines.  Any blood disorders you have.  Any surgeries you have had.  Any medical conditions you have.  Whether you are pregnant or may be pregnant. What are the risks? Generally, this is a safe procedure. However, problems may occur,  including:  Bleeding.  Infection.  Allergic reactions to medicines.  Scarring that narrows the ureter (stricture).  Creating a hole in the ureter (perforation). What happens before the procedure? Staying hydrated Follow instructions from your health care provider about hydration, which may include:  Up to 2 hours before the procedure - you may continue to drink clear liquids, such as water, clear fruit juice, black coffee, and plain tea. Eating and drinking restrictions Follow instructions from your health care provider about eating and drinking, which may include:  8 hours before the procedure - stop eating heavy meals or foods such as meat, fried foods, or fatty foods.  6 hours before the procedure - stop eating light meals or foods, such as toast or cereal.  6 hours before the procedure - stop drinking milk or drinks that contain milk.  2 hours before the procedure - stop drinking clear liquids. Medicines  Ask your health care provider about: ? Changing or stopping your regular medicines. This is especially important if you are taking  diabetes medicines or blood thinners. ? Taking medicines such as aspirin and ibuprofen. These medicines can thin your blood. Do not take these medicines before your procedure if your health care provider instructs you not to.  You may be given antibiotic medicine to help prevent infection. General instructions  You may have a urine sample taken to check for infection.  Plan to have someone take you home from the hospital or clinic. What happens during the procedure?   To reduce your risk of infection: ? Your health care team will wash or sanitize their hands. ? Your skin will be washed with soap.  An IV tube will be inserted into one of your veins.  You will be given one of the following: ? A medicine to help you relax (sedative). ? A medicine to make you fall asleep (general anesthetic). ? A medicine that is injected into your spine  to numb the area below and slightly above the injection site (spinal anesthetic).  To lower your risk of infection, you may be given an antibiotic medicine by an injection or through the IV tube.  The opening from which you urinate (urethra) will be cleaned with a germ-killing solution.  The ureteroscope will be passed through your urethra into your bladder.  A salt-water solution will flow through the ureteroscope to fill your bladder. This will help the health care provider see the openings of your ureters more clearly.  Then, the ureteroscope will be passed into your ureter. ? If a growth is found, a piece of it may be removed so it can be examined under a microscope (biopsy). ? If a stone is found, it may be removed through the ureteroscope, or the stone may be broken up using a laser, shock waves, or electrical energy. ? In some cases, if the ureter is too small, a tube may be inserted that keeps the ureter open (ureteral stent). The stent may be left in place for 1 or 2 weeks to keep the ureter open, and then the ureteroscopy procedure will be performed.  The scope will be removed, and your bladder will be emptied. The procedure may vary among health care providers and hospitals. What happens after the procedure?  Your blood pressure, heart rate, breathing rate, and blood oxygen level will be monitored until the medicines you were given have worn off.  You may be asked to urinate.  Donot drive for 24 hours if you were given a sedative. This information is not intended to replace advice given to you by your health care provider. Make sure you discuss any questions you have with your health care provider. Document Revised: 12/18/2016 Document Reviewed: 10/18/2015 Elsevier Patient Education  2020 [employer].

## 2019-09-28 NOTE — Anesthesia Preprocedure Evaluation (Addendum)
Anesthesia Evaluation  Patient identified by MRN, date of birth, ID band Patient awake    Reviewed: Allergy & Precautions, H&P , NPO status , Patient's Chart, lab work & pertinent test results  History of Anesthesia Complications Negative for: history of anesthetic complications  Airway Mallampati: I  TM Distance: >3 FB Neck ROM: full    Dental  (+) Teeth Intact   Pulmonary neg pulmonary ROS, neg sleep apnea, neg COPD, Not current smoker,    breath sounds clear to auscultation       Cardiovascular (-) angina(-) Past MI and (-) Cardiac Stents negative cardio ROS  (-) dysrhythmias  Rhythm:regular Rate:Normal     Neuro/Psych negative neurological ROS  negative psych ROS   GI/Hepatic negative GI ROS, Neg liver ROS,   Endo/Other  Hypothyroidism   Renal/GU      Musculoskeletal  (+) Arthritis ,   Abdominal   Peds  Hematology negative hematology ROS (+)   Anesthesia Other Findings Past Medical History: No date: Arthritis No date: Diverticulitis 05/31/2019: History of diverticulitis 05/31/2019: History of hydronephrosis 05/31/2019: History of nephrolithiasis 05/30/2019: Hypothyroid 05/31/2019: IBS (irritable bowel syndrome) No date: Kidney stones 05/31/2019: Neck arthritis 05/31/2019: Nephrolithiasis No date: Thyroid disease  Past Surgical History: No date: ABDOMINAL HYSTERECTOMY 1999: ANAL FISSURE REPAIR     Comment:  with sphincterotomy 06/2012: BREAST BIOPSY; Left     Comment:  neg No date: HYSTERECTOMY ABDOMINAL WITH SALPINGECTOMY  BMI    Body Mass Index: 21.80 kg/m      Reproductive/Obstetrics negative OB ROS                            Anesthesia Physical Anesthesia Plan  ASA: II  Anesthesia Plan: General LMA   Post-op Pain Management:    Induction:   PONV Risk Score and Plan: Dexamethasone, Midazolam, Treatment may vary due to age or medical condition and  Ondansetron  Airway Management Planned:   Additional Equipment:   Intra-op Plan:   Post-operative Plan:   Informed Consent: I have reviewed the patients History and Physical, chart, labs and discussed the procedure including the risks, benefits and alternatives for the proposed anesthesia with the patient or authorized representative who has indicated his/her understanding and acceptance.     Dental Advisory Given  Plan Discussed with: Anesthesiologist, CRNA and Surgeon  Anesthesia Plan Comments:         Anesthesia Quick Evaluation

## 2019-10-02 DIAGNOSIS — Z139 Encounter for screening, unspecified: Secondary | ICD-10-CM | POA: Diagnosis not present

## 2019-10-09 DIAGNOSIS — Z23 Encounter for immunization: Secondary | ICD-10-CM | POA: Diagnosis not present

## 2019-10-10 DIAGNOSIS — N201 Calculus of ureter: Secondary | ICD-10-CM | POA: Diagnosis not present

## 2019-12-07 DIAGNOSIS — S86911A Strain of unspecified muscle(s) and tendon(s) at lower leg level, right leg, initial encounter: Secondary | ICD-10-CM | POA: Diagnosis not present

## 2019-12-07 DIAGNOSIS — M25561 Pain in right knee: Secondary | ICD-10-CM | POA: Diagnosis not present

## 2019-12-22 DIAGNOSIS — M25561 Pain in right knee: Secondary | ICD-10-CM | POA: Diagnosis not present

## 2019-12-22 DIAGNOSIS — M25661 Stiffness of right knee, not elsewhere classified: Secondary | ICD-10-CM | POA: Diagnosis not present

## 2019-12-25 DIAGNOSIS — R1030 Lower abdominal pain, unspecified: Secondary | ICD-10-CM | POA: Diagnosis not present

## 2019-12-25 DIAGNOSIS — Z1152 Encounter for screening for COVID-19: Secondary | ICD-10-CM | POA: Diagnosis not present

## 2020-01-18 DIAGNOSIS — K5790 Diverticulosis of intestine, part unspecified, without perforation or abscess without bleeding: Secondary | ICD-10-CM | POA: Diagnosis not present

## 2020-01-18 DIAGNOSIS — R1030 Lower abdominal pain, unspecified: Secondary | ICD-10-CM | POA: Diagnosis not present

## 2020-02-13 DIAGNOSIS — Z1231 Encounter for screening mammogram for malignant neoplasm of breast: Secondary | ICD-10-CM | POA: Diagnosis not present

## 2020-02-13 DIAGNOSIS — Z0189 Encounter for other specified special examinations: Secondary | ICD-10-CM | POA: Diagnosis not present

## 2020-02-15 DIAGNOSIS — E785 Hyperlipidemia, unspecified: Secondary | ICD-10-CM | POA: Diagnosis not present

## 2020-02-15 DIAGNOSIS — Z043 Encounter for examination and observation following other accident: Secondary | ICD-10-CM | POA: Diagnosis not present

## 2020-02-15 DIAGNOSIS — Z713 Dietary counseling and surveillance: Secondary | ICD-10-CM | POA: Diagnosis not present

## 2020-02-26 ENCOUNTER — Other Ambulatory Visit: Payer: Self-pay | Admitting: Family Medicine

## 2020-02-26 DIAGNOSIS — Z1231 Encounter for screening mammogram for malignant neoplasm of breast: Secondary | ICD-10-CM

## 2020-03-01 DIAGNOSIS — E785 Hyperlipidemia, unspecified: Secondary | ICD-10-CM | POA: Diagnosis not present

## 2020-03-04 DIAGNOSIS — Z1152 Encounter for screening for COVID-19: Secondary | ICD-10-CM | POA: Diagnosis not present

## 2020-03-18 ENCOUNTER — Other Ambulatory Visit: Payer: Self-pay

## 2020-03-18 ENCOUNTER — Ambulatory Visit
Admission: RE | Admit: 2020-03-18 | Discharge: 2020-03-18 | Disposition: A | Discharge status: Home / Self Care | Payer: BC Managed Care – PPO | Source: Ambulatory Visit | Attending: Family Medicine | Admitting: Family Medicine

## 2020-03-18 DIAGNOSIS — Z1231 Encounter for screening mammogram for malignant neoplasm of breast: Secondary | ICD-10-CM | POA: Insufficient documentation

## 2020-05-14 DIAGNOSIS — Z0189 Encounter for other specified special examinations: Secondary | ICD-10-CM | POA: Diagnosis not present

## 2020-05-15 DIAGNOSIS — E785 Hyperlipidemia, unspecified: Secondary | ICD-10-CM | POA: Diagnosis not present

## 2020-05-31 DIAGNOSIS — M25561 Pain in right knee: Secondary | ICD-10-CM | POA: Diagnosis not present

## 2020-06-05 DIAGNOSIS — M25561 Pain in right knee: Secondary | ICD-10-CM | POA: Diagnosis not present

## 2020-06-19 DIAGNOSIS — S83241A Other tear of medial meniscus, current injury, right knee, initial encounter: Secondary | ICD-10-CM | POA: Diagnosis not present

## 2020-08-05 DIAGNOSIS — Z1152 Encounter for screening for COVID-19: Secondary | ICD-10-CM | POA: Diagnosis not present

## 2020-10-22 DIAGNOSIS — Z23 Encounter for immunization: Secondary | ICD-10-CM | POA: Diagnosis not present

## 2020-12-31 DIAGNOSIS — K5792 Diverticulitis of intestine, part unspecified, without perforation or abscess without bleeding: Secondary | ICD-10-CM | POA: Diagnosis not present

## 2021-01-08 DIAGNOSIS — Z1152 Encounter for screening for COVID-19: Secondary | ICD-10-CM | POA: Diagnosis not present

## 2021-01-16 DIAGNOSIS — K5790 Diverticulosis of intestine, part unspecified, without perforation or abscess without bleeding: Secondary | ICD-10-CM | POA: Diagnosis not present

## 2021-01-16 DIAGNOSIS — K5792 Diverticulitis of intestine, part unspecified, without perforation or abscess without bleeding: Secondary | ICD-10-CM | POA: Diagnosis not present

## 2021-01-16 DIAGNOSIS — R1030 Lower abdominal pain, unspecified: Secondary | ICD-10-CM | POA: Diagnosis not present

## 2021-01-27 DIAGNOSIS — S86012A Strain of left Achilles tendon, initial encounter: Secondary | ICD-10-CM | POA: Diagnosis not present

## 2021-01-27 DIAGNOSIS — M7661 Achilles tendinitis, right leg: Secondary | ICD-10-CM | POA: Diagnosis not present

## 2021-01-27 DIAGNOSIS — S86011A Strain of right Achilles tendon, initial encounter: Secondary | ICD-10-CM | POA: Diagnosis not present

## 2021-01-27 DIAGNOSIS — M79671 Pain in right foot: Secondary | ICD-10-CM | POA: Diagnosis not present

## 2021-01-27 DIAGNOSIS — M79672 Pain in left foot: Secondary | ICD-10-CM | POA: Diagnosis not present

## 2021-01-31 ENCOUNTER — Other Ambulatory Visit: Payer: Self-pay | Admitting: Gastroenterology

## 2021-01-31 DIAGNOSIS — R1032 Left lower quadrant pain: Secondary | ICD-10-CM

## 2021-02-10 DIAGNOSIS — Z0189 Encounter for other specified special examinations: Secondary | ICD-10-CM | POA: Diagnosis not present

## 2021-02-10 LAB — BASIC METABOLIC PANEL: Sodium: 138 (ref 137–147)

## 2021-02-11 DIAGNOSIS — Z713 Dietary counseling and surveillance: Secondary | ICD-10-CM | POA: Diagnosis not present

## 2021-02-11 DIAGNOSIS — E785 Hyperlipidemia, unspecified: Secondary | ICD-10-CM | POA: Diagnosis not present

## 2021-02-11 DIAGNOSIS — Z043 Encounter for examination and observation following other accident: Secondary | ICD-10-CM | POA: Diagnosis not present

## 2021-02-17 ENCOUNTER — Other Ambulatory Visit: Payer: Self-pay

## 2021-02-17 ENCOUNTER — Ambulatory Visit
Admission: RE | Admit: 2021-02-17 | Discharge: 2021-02-17 | Disposition: A | Discharge status: Home / Self Care | Payer: BC Managed Care – PPO | Source: Ambulatory Visit | Attending: Gastroenterology | Admitting: Gastroenterology

## 2021-02-17 DIAGNOSIS — K760 Fatty (change of) liver, not elsewhere classified: Secondary | ICD-10-CM | POA: Diagnosis not present

## 2021-02-17 DIAGNOSIS — R109 Unspecified abdominal pain: Secondary | ICD-10-CM | POA: Diagnosis not present

## 2021-02-17 DIAGNOSIS — R1032 Left lower quadrant pain: Secondary | ICD-10-CM | POA: Insufficient documentation

## 2021-02-17 LAB — POCT I-STAT CREATININE: Creatinine, Ser: 1.1 mg/dL — ABNORMAL HIGH (ref 0.44–1.00)

## 2021-02-17 MED ORDER — IOHEXOL 300 MG/ML  SOLN
85.0000 mL | Freq: Once | INTRAMUSCULAR | Status: AC | PRN
  Administered 2021-02-17: 85 mL via INTRAVENOUS

## 2021-04-14 ENCOUNTER — Other Ambulatory Visit: Payer: Self-pay | Admitting: Family Medicine

## 2021-04-14 DIAGNOSIS — Z1231 Encounter for screening mammogram for malignant neoplasm of breast: Secondary | ICD-10-CM

## 2021-04-17 DIAGNOSIS — M25552 Pain in left hip: Secondary | ICD-10-CM | POA: Diagnosis not present

## 2021-05-20 ENCOUNTER — Ambulatory Visit
Admission: RE | Admit: 2021-05-20 | Discharge: 2021-05-20 | Disposition: A | Discharge status: Home / Self Care | Payer: BC Managed Care – PPO | Source: Ambulatory Visit | Attending: Family Medicine | Admitting: Family Medicine

## 2021-05-20 DIAGNOSIS — Z1231 Encounter for screening mammogram for malignant neoplasm of breast: Secondary | ICD-10-CM | POA: Diagnosis not present

## 2021-08-01 DIAGNOSIS — Z0189 Encounter for other specified special examinations: Secondary | ICD-10-CM | POA: Diagnosis not present

## 2021-08-05 DIAGNOSIS — M7661 Achilles tendinitis, right leg: Secondary | ICD-10-CM | POA: Diagnosis not present

## 2021-08-05 DIAGNOSIS — S86011D Strain of right Achilles tendon, subsequent encounter: Secondary | ICD-10-CM | POA: Diagnosis not present

## 2021-08-05 DIAGNOSIS — M7662 Achilles tendinitis, left leg: Secondary | ICD-10-CM | POA: Diagnosis not present

## 2021-08-19 DIAGNOSIS — Z0189 Encounter for other specified special examinations: Secondary | ICD-10-CM | POA: Diagnosis not present

## 2021-08-19 LAB — BASIC METABOLIC PANEL
BUN: 21 (ref 4–21)
Chloride: 101 (ref 99–108)
Creatinine: 1 (ref 0.5–1.1)
Glucose: 97
Potassium: 4.9 mEq/L (ref 3.5–5.1)

## 2021-08-19 LAB — LIPID PANEL
Cholesterol: 223 — AB (ref 0–200)
HDL: 52 (ref 35–70)
LDL Cholesterol: 159
LDl/HDL Ratio: 4.3
Triglycerides: 70 (ref 40–160)

## 2021-08-19 LAB — HEPATIC FUNCTION PANEL
ALT: 11 U/L (ref 7–35)
AST: 20 (ref 13–35)
Alkaline Phosphatase: 83 (ref 25–125)
Bilirubin, Total: 0.5

## 2021-08-19 LAB — COMPREHENSIVE METABOLIC PANEL
Albumin: 4.5 (ref 3.5–5.0)
Calcium: 9.8 (ref 8.7–10.7)
Globulin: 2.2
eGFR: 61

## 2021-08-19 LAB — VITAMIN B12: Vitamin B-12: 381

## 2021-08-19 LAB — VITAMIN D 25 HYDROXY (VIT D DEFICIENCY, FRACTURES): Vit D, 25-Hydroxy: 40.5

## 2021-08-19 LAB — PROTEIN / CREATININE RATIO, URINE
Albumin, U: 4.5
Creatinine, Urine: 1.03

## 2021-08-19 LAB — TSH: TSH: 0.1 — AB (ref 0.41–5.90)

## 2021-08-20 DIAGNOSIS — E785 Hyperlipidemia, unspecified: Secondary | ICD-10-CM | POA: Diagnosis not present

## 2021-08-28 ENCOUNTER — Encounter: Payer: Self-pay | Admitting: Family Medicine

## 2021-08-28 ENCOUNTER — Ambulatory Visit (INDEPENDENT_AMBULATORY_CARE_PROVIDER_SITE_OTHER): Payer: BC Managed Care – PPO | Admitting: Family Medicine

## 2021-08-28 VITALS — BP 122/78 | HR 62 | Temp 98.2°F | Ht 64.0 in | Wt 129.0 lb

## 2021-08-28 DIAGNOSIS — E039 Hypothyroidism, unspecified: Secondary | ICD-10-CM | POA: Diagnosis not present

## 2021-08-28 DIAGNOSIS — E782 Mixed hyperlipidemia: Secondary | ICD-10-CM

## 2021-08-28 DIAGNOSIS — Z13820 Encounter for screening for osteoporosis: Secondary | ICD-10-CM

## 2021-08-28 MED ORDER — SHINGRIX 50 MCG/0.5ML IM SUSR: 0.5000 mL | Freq: Once | INTRAMUSCULAR | 1 refills | Status: AC

## 2021-08-28 NOTE — Patient Instructions (Signed)
It was a pleasure meeting you today. Thank you for allowing me to take part in your health care.  Our goals for today as we discussed include:  Your thyroid level is slightly low.  I would like to repeat levels in 4 to 6 weeks.  Continue Synthroid 125 mcg daily.  Please schedule appointment in 2 weeks to discuss hormonal therapy.   Referral sent for Bone scan to check for osteoporosis. Please call to schedule appointment. [facility] [address] [place], [place] [address] [phone]     Please follow-up with PCP in 2 weeks.  If you have any questions or concerns, please do not hesitate to call the office at [phone].  I look forward to our next visit and until then take care and stay safe.  Regards,   Dana Allan, MD   [facility]

## 2021-08-28 NOTE — Progress Notes (Signed)
    SUBJECTIVE:   CHIEF COMPLAINT / HPI: transfer of care  Patient presents to clinic to transfer care to new PCP  No acute concerns today  Hypothyroid Asymptomatic. Compliant with medication. Recently had TSH level, no recent changes to Synthroid.     PERTINENT  PMH / PSH:  Hypothyroid IBS OSA of hands and C-spine Nephrolithiasis Colon polyp removal  Hysterectomy for fibroids Anal fissure repair Left kidney stone removal  Paternal dementia, valve replacement Maternal hypothyroid, DM type II, hypertension, arthritis Sister, Parkinson's, HTN, DM, sarcoidosis Brother, cystic fibrosis, cardiomyopathy, DM type II Sister, hyperlipidemia, hypertension, depression Sister, IBS, hypertension, hyperlipidemia Sister, obesity, hypertension, sleep apnea  No tobacco use EtOH use No THC/cocaine/heroin use  Currently employed at [employer] Recent mammogram 2023 normal Recent colonoscopy 2021 normal Pap not applicable  OBJECTIVE:   BP 122/78 (BP Location: Left Arm, Patient Position: Sitting, Cuff Size: Normal)   Pulse 62   Temp 98.2 F (36.8 C) (Oral)   Ht 5\' 4"  (1.626 m)   Wt 129 lb (58.5 kg)   SpO2 99%   BMI 22.14 kg/m    General: Alert, no acute distress Cardio: Normal S1 and S2, RRR, no r/m/g Pulm: CTAB, normal work of breathing Abdomen: Bowel sounds normal. Abdomen soft and non-tender.  Extremities: No peripheral edema.  Neuro: Cranial nerves grossly intact   ASSESSMENT/PLAN:   Hypothyroid Last TSH less than 0.102, T4 10.1. Currently asymptomatic. -Continue Synthroid 125 mcg daily -Repeat TSH in 4 to 6 weeks. -Strict return precautions provided  Hyperlipidemia ASCVD risk low at 4.1%.   -Continue current lifestyle management, monitoring diet and increasing exercise.   HCM Shingrix prescription today DEXA scan today Colonoscopy up-to-date Mammogram up-to-date  PDMP reviewed  , MD

## 2021-09-07 ENCOUNTER — Encounter: Payer: Self-pay | Admitting: Family Medicine

## 2021-09-07 DIAGNOSIS — E785 Hyperlipidemia, unspecified: Secondary | ICD-10-CM | POA: Insufficient documentation

## 2021-09-07 NOTE — Assessment & Plan Note (Addendum)
Last TSH less than 0.102, T4 10.1. Currently asymptomatic. -Continue Synthroid 125 mcg daily -Repeat TSH in 4 to 6 weeks. -Strict return precautions provided

## 2021-09-07 NOTE — Assessment & Plan Note (Signed)
ASCVD risk low at 4.1%.   -Continue current lifestyle management, monitoring diet and increasing exercise.

## 2021-09-08 NOTE — Telephone Encounter (Signed)
Discontinue Levothyroxine 125 mcg.  Start Levothyroxine 100 mcg daily  Schedule appointment for repeat TSH, Free T4, Free T3 in 4-6 weeks.

## 2021-09-09 ENCOUNTER — Other Ambulatory Visit: Payer: Self-pay | Admitting: Family Medicine

## 2021-09-09 ENCOUNTER — Other Ambulatory Visit: Payer: Self-pay

## 2021-09-09 MED ORDER — LEVOTHYROXINE SODIUM 100 MCG PO TABS: 100.0000 ug | ORAL_TABLET | Freq: Every day | ORAL | 3 refills | Status: DC

## 2021-09-09 NOTE — Telephone Encounter (Signed)
Spoke to Patient to let her know that we changed the Synthyroid 125 mcg to Synthyroid 100 mcg and ordered TSH, Free T3 and Free T4 labs and got her scheduled for 10/14/21 . Let Patient know to pick the Synthyroid up at [facility]. Patient verbalized understanding and is agreeable.

## 2021-09-09 NOTE — Telephone Encounter (Signed)
noted 

## 2021-09-09 NOTE — Addendum Note (Signed)
Addended by: Prince Solian A on: 09/09/2021 09:51 AM   Modules accepted: Orders

## 2021-09-10 ENCOUNTER — Encounter: Payer: Self-pay | Admitting: Family Medicine

## 2021-09-11 ENCOUNTER — Encounter: Payer: Self-pay | Admitting: Family Medicine

## 2021-09-12 ENCOUNTER — Ambulatory Visit
Admission: RE | Admit: 2021-09-12 | Discharge: 2021-09-12 | Disposition: A | Discharge status: Home / Self Care | Payer: BC Managed Care – PPO | Source: Ambulatory Visit | Attending: Family Medicine | Admitting: Family Medicine

## 2021-09-12 DIAGNOSIS — M85851 Other specified disorders of bone density and structure, right thigh: Secondary | ICD-10-CM | POA: Diagnosis not present

## 2021-09-12 DIAGNOSIS — Z780 Asymptomatic menopausal state: Secondary | ICD-10-CM | POA: Diagnosis not present

## 2021-09-12 DIAGNOSIS — Z13820 Encounter for screening for osteoporosis: Secondary | ICD-10-CM | POA: Diagnosis not present

## 2021-09-16 ENCOUNTER — Encounter: Payer: Self-pay | Admitting: Family Medicine

## 2021-09-16 ENCOUNTER — Ambulatory Visit: Payer: BC Managed Care – PPO | Admitting: Family Medicine

## 2021-09-16 VITALS — BP 126/76 | HR 68 | Temp 97.8°F | Ht 63.0 in | Wt 127.4 lb

## 2021-09-16 DIAGNOSIS — R232 Flushing: Secondary | ICD-10-CM | POA: Diagnosis not present

## 2021-09-16 MED ORDER — ESTRADIOL 0.25 MG/0.25GM TD GEL
0.2500 mg | Freq: Every day | TRANSDERMAL | 3 refills | Status: DC
Start: 2021-09-16 — End: 2022-01-21

## 2021-09-16 NOTE — Progress Notes (Signed)
    SUBJECTIVE:   CHIEF COMPLAINT / HPI: discuss hormone therapy  Hot flashes Hot flashes began at 61 years old. Was on Premarin had stopped in 2020. Had abdominal hysterectomy for uterine fibroid.  No vaginal bleeding. Not sexually active. Increased vaginal dryness.  Denies any history of breast cancer, endometrial cancer, DVT, TIA, CVA or CHD.    PERTINENT  PMH / PSH:  Hypothyroid Postmenopausal   OBJECTIVE:   BP 126/76 (BP Location: Left Arm, Patient Position: Sitting, Cuff Size: Normal)   Pulse 68   Temp 97.8 F (36.6 C) (Oral)   Ht 5\' 3"  (1.6 m)   Wt 127 lb 6.4 oz (57.8 kg)   SpO2 98%   BMI 22.57 kg/m    General: Alert, no acute distress Cardio: Normal S1 and S2, RRR, no r/m/g Pulm: CTAB, normal work of breathing Abdomen: Bowel sounds normal. Abdomen soft and non-tender.  Extremities: No peripheral edema.    ASSESSMENT/PLAN:   Hot flashes Has been ongoing for 20 years.  Now worsening symptoms and associated vaginal dryness.  Discussed risks to include cardiac, VTE, cancer. ASCVD risk 4.2%.  Breast Cancer risk- 5 year risk 2%, lifetime risk 9 %.  Patient aware and would like to continue with treatment.   -Start Estradiol gel 0.25 daily topical.   -Discussed that gel can transfer with close contact  -Follow up in 3 months      , MD [facility] Medicine [facility]

## 2021-09-16 NOTE — Patient Instructions (Signed)
It was a pleasure meeting you today. Thank you for allowing me to take part in your health care.  Our goals for today as we discussed include:  For your hot flashes  Apply Estradiol gel to the inner thigh each night.  Medication can be transmitted to other people by contact and can cause hormonal effects.  Please follow-up with PCP in 3 months or sooner if needed  If you have any questions or concerns, please do not hesitate to call the office at [phone].  I look forward to our next visit and until then take care and stay safe.  Regards,   Dana Allan, MD   [place]   Menopause and Hormone Replacement Therapy Menopause is a normal time of life when menstrual periods stop completely and the ovaries stop producing the female hormones estrogen and progesterone. Low levels of these hormones can affect your health and cause symptoms. Hormone replacement therapy (HRT) can relieve some of those symptoms. HRT is the use of artificial (synthetic) hormones to replace hormones that your body has stopped producing because you have reached menopause. Types of HRT  HRT may consist of the synthetic hormones estrogen and progestin, or it may consist of estrogen-only therapy. You and your health care provider will decide which form of HRT is best for you. If you choose to be on HRT and you have a uterus, estrogen and progestin are usually prescribed. Estrogen-only therapy is used for women who do not have a uterus. Possible options for taking HRT include: Pills. Patches. Gels. Sprays. Vaginal cream. Vaginal rings. Vaginal inserts. The amount of hormones that you take and how long you take them varies according to your health. It is important to: Begin HRT with the lowest possible dosage. Stop HRT as soon as your health care provider tells you to stop. Work with your health care provider so that you feel informed and comfortable with your decisions. Tell a health care  provider about: Any allergies you have. Whether you have had blood clots or know of any risk factors you may have for blood clots. Whether you or family members have had cancer, especially cancer of the breasts, ovaries, or uterus. Any surgeries you have had. All medicines you are taking, including vitamins, herbs, eye drops, creams, and over-the-counter medicines. Whether you are pregnant or may be pregnant. Any medical conditions you have. What are the benefits? HRT can reduce the frequency and severity of menopausal symptoms. Benefits of HRT vary according to the kind of symptoms that you have, how severe they are, and your overall health. HRT may help to improve the following symptoms of menopause: Hot flashes and night sweats. These are sudden feelings of heat that spread over the face and body. The skin may turn red, like a blush. Night sweats are hot flashes that happen while you are sleeping or trying to sleep. Bone loss (osteoporosis). The body loses calcium more quickly after menopause, causing the bones to become weaker. This can increase the risk for bone breaks (fractures). Vaginal dryness. The lining of the vagina can become thin and dry, which can cause pain during sex or cause infection, burning, or itching. Urinary tract infections. Urinary incontinence. This is the inability to control when you urinate. Irritability. Short-term memory problems. What are the risks? Risks of HRT vary depending on your individual health and medical history. Risks of HRT also depend on whether you receive both estrogen and progestin or you receive estrogen only. HRT may increase  the risk of: Spotting. This is when a small amount of blood leaks from the vagina unexpectedly. Endometrial cancer. This cancer is in the lining of the uterus (endometrium). Breast cancer. Increased density of breast tissue. This can make it harder to find breast cancer on a breast X-ray (mammogram). Stroke. Heart  disease. Blood clots. Gallbladder disease or liver disease. Risks of HRT can increase if you have any of the following conditions: Endometrial cancer. Liver disease. Heart disease. Breast cancer. History of blood clots. History of stroke. Follow these instructions at home: Pap tests Have Pap tests done as often as told by your health care provider. A Pap test is sometimes called a Pap smear. It is a screening test that is used to check for signs of cancer of the cervix and vagina. A Pap test can also identify the presence of infection or precancerous changes. Pap tests may be done: Every 3 years, starting at age 64. Every 5 years, starting after age 81, in combination with testing for human papillomavirus (HPV). More often or less often depending on other medical conditions you have, your age, and other risk factors. It is up to you to get the results of your Pap test. Ask your health care provider, or the department that is doing the test, when your results will be ready. General instructions Take over-the-counter and prescription medicines only as told by your health care provider. Do not use any products that contain nicotine or tobacco. These products include cigarettes, chewing tobacco, and vaping devices, such as e-cigarettes. If you need help quitting, ask your health care provider. Get mammograms, pelvic exams, and medical checkups as often as told by your health care provider. Keep all follow-up visits. This is important. Contact a health care provider if you have: Pain or swelling in your legs. Lumps or changes in your breasts or armpits. Pain, burning, or bleeding when you urinate. Unusual vaginal bleeding. Dizziness or headaches. Pain in your abdomen. Get help right away if you have: Shortness of breath. Chest pain. Slurred speech. Weakness or numbness in any part of your arms or legs. These symptoms may represent a serious problem that is an emergency. Do not wait to  see if the symptoms will go away. Get medical help right away. Call your local emergency services (911 in the [place] Do not drive yourself to the hospital. Summary Menopause is a normal time of life when menstrual periods stop completely and the ovaries stop producing the female hormones estrogen and progesterone. HRT can reduce the frequency and severity of menopausal symptoms. Risks of HRT vary depending on your individual health and medical history. This information is not intended to replace advice given to you by your health care provider. Make sure you discuss any questions you have with your health care provider. Document Revised: 07/10/2019 Document Reviewed: 07/10/2019 Elsevier Patient Education  2023 [employer].

## 2021-09-22 ENCOUNTER — Encounter: Payer: Self-pay | Admitting: Family Medicine

## 2021-09-22 NOTE — Assessment & Plan Note (Addendum)
Has been ongoing for 20 years.  Now worsening symptoms and associated vaginal dryness.  Discussed risks to include cardiac, VTE, cancer. ASCVD risk 4.2%.  Breast Cancer risk- 5 year risk 2%, lifetime risk 9 %.  Patient aware and would like to continue with treatment.   -Start Estradiol gel 0.25 daily topical.   -Discussed that gel can transfer with close contact  -Follow up in 3 months

## 2021-10-08 DIAGNOSIS — Z23 Encounter for immunization: Secondary | ICD-10-CM | POA: Diagnosis not present

## 2021-10-09 ENCOUNTER — Ambulatory Visit: Payer: BC Managed Care – PPO | Admitting: Family Medicine

## 2021-10-09 ENCOUNTER — Telehealth: Payer: Self-pay | Admitting: Family Medicine

## 2021-10-09 ENCOUNTER — Other Ambulatory Visit: Payer: Self-pay

## 2021-10-09 DIAGNOSIS — E039 Hypothyroidism, unspecified: Secondary | ICD-10-CM

## 2021-10-09 NOTE — Telephone Encounter (Signed)
Ordered

## 2021-10-09 NOTE — Addendum Note (Signed)
Addended by: Jeralyn Bennett A on: 10/09/2021 12:17 PM   Modules accepted: Orders

## 2021-10-09 NOTE — Telephone Encounter (Signed)
Labs corrected

## 2021-10-09 NOTE — Telephone Encounter (Signed)
Patient has a lab appt 10/14/2021. Appointment message states Free T3, Free T4 and Tsh.Only a TSH was ordered.

## 2021-10-14 ENCOUNTER — Other Ambulatory Visit (INDEPENDENT_AMBULATORY_CARE_PROVIDER_SITE_OTHER): Payer: BC Managed Care – PPO

## 2021-10-14 DIAGNOSIS — E039 Hypothyroidism, unspecified: Secondary | ICD-10-CM | POA: Diagnosis not present

## 2021-10-14 LAB — TSH: TSH: 2.74 u[IU]/mL (ref 0.35–5.50)

## 2021-10-14 LAB — T3, FREE: T3, Free: 2.5 pg/mL (ref 2.3–4.2)

## 2021-10-14 LAB — T4, FREE: Free T4: 0.87 ng/dL (ref 0.60–1.60)

## 2021-10-27 IMAGING — MG DIGITAL DIAGNOSTIC BILAT W/ TOMO W/ CAD
6 of 10 series · 6 of 30 positions shown · non-contrast
Comparison: Previous exam(s).

CLINICAL DATA: 59-year-old female to over the [REDACTED] developed a
lump with tenderness in the left breast at about 3 o'clock. This was
around the same time that the patient stopped taking estrogen
replacement therapy. The symptoms have improved with just residual
thickening and occasional tenderness.

EXAM:
DIGITAL DIAGNOSTIC RIGHT MAMMOGRAM WITH CAD AND TOMO
ULTRASOUND RIGHT BREAST

[L CC synth-2D (1 of 2)]
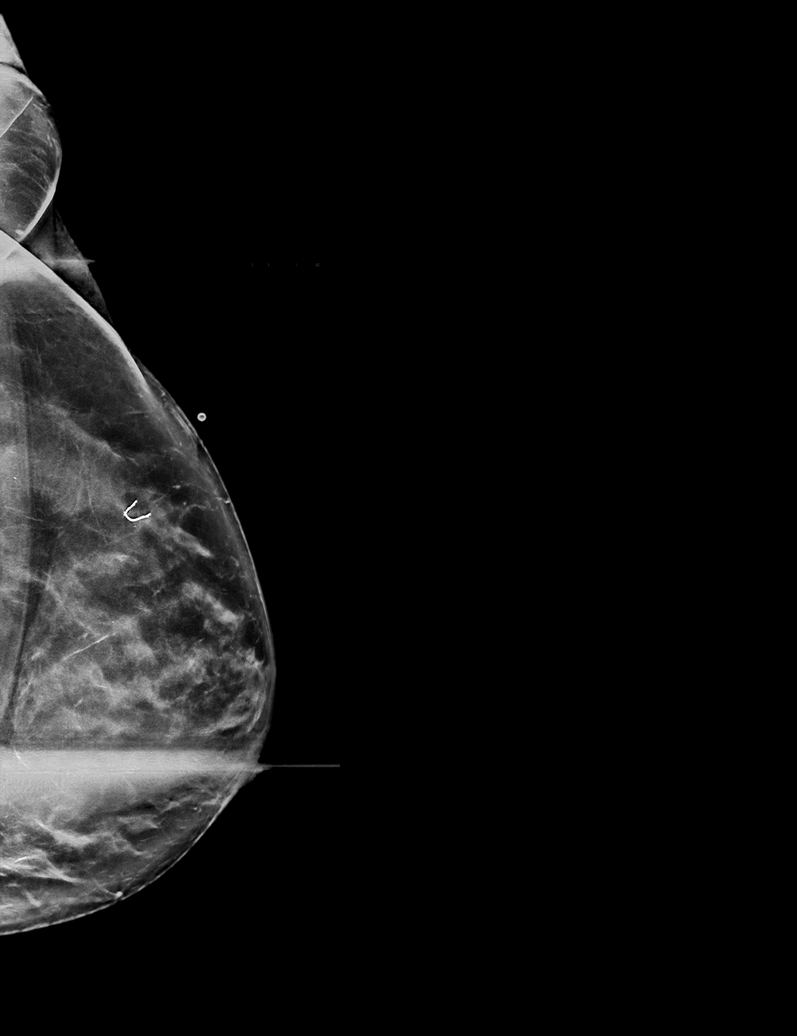

[L MLO synth-2D]
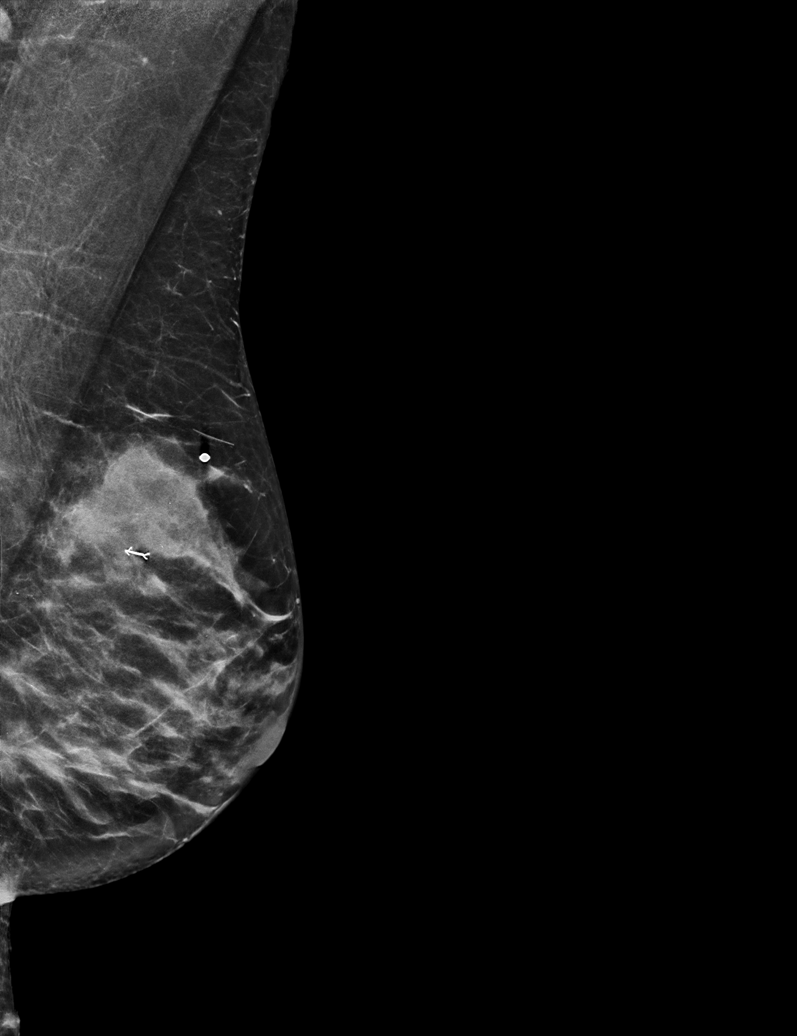

[L CC synth-2D (2 of 2)]
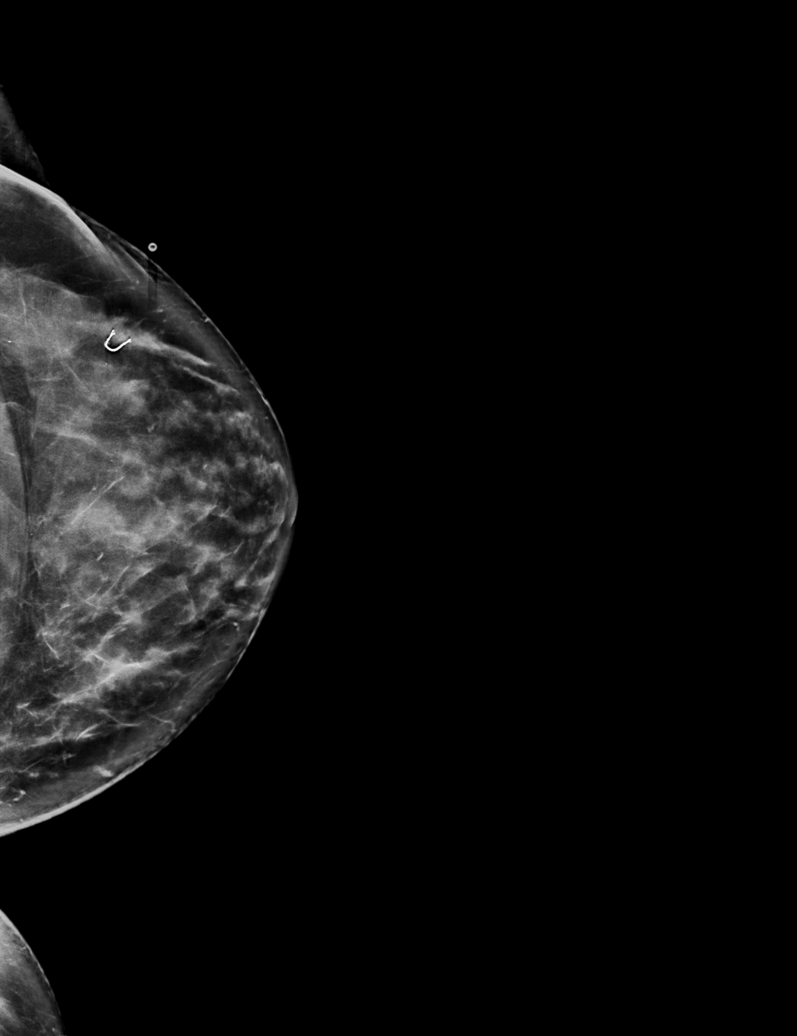

[R MLO synth-2D]
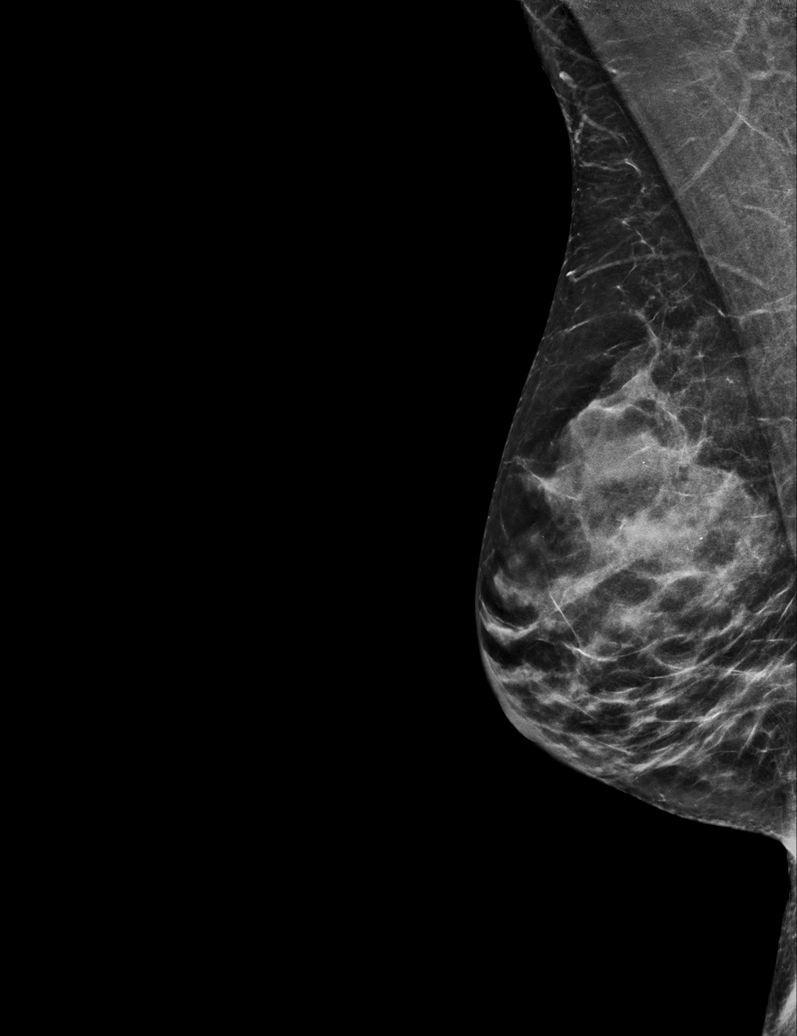

[R CC synth-2D]
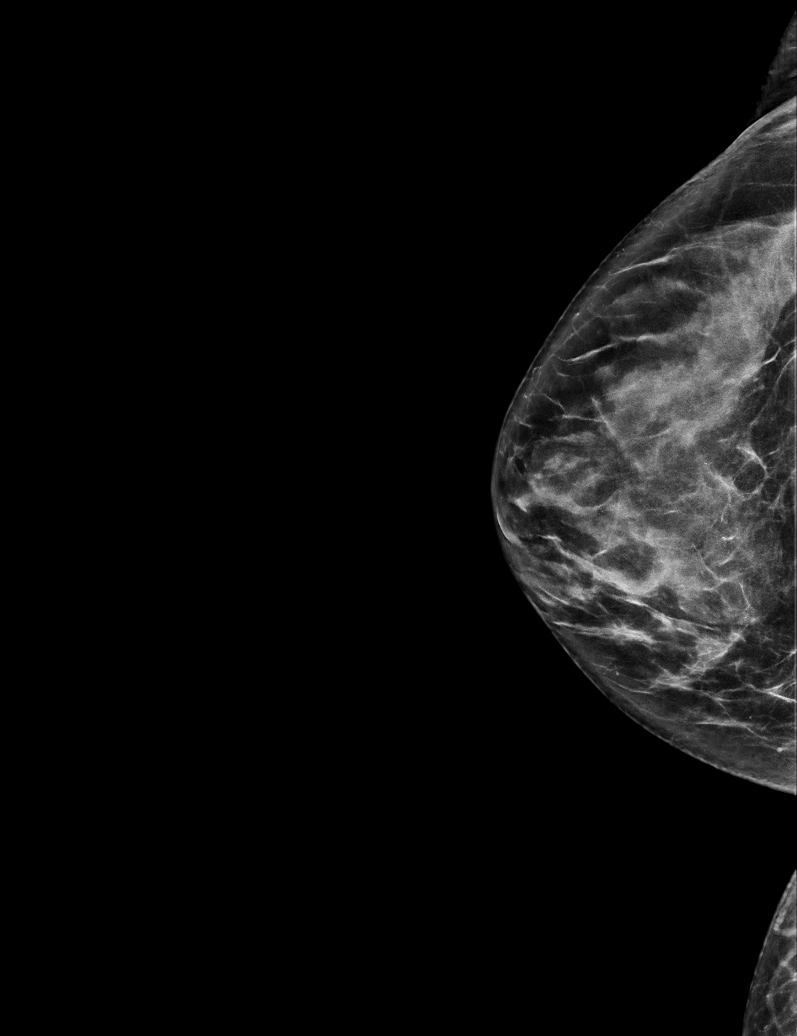

[L MLO tomo · tomo slice 29/56.0]
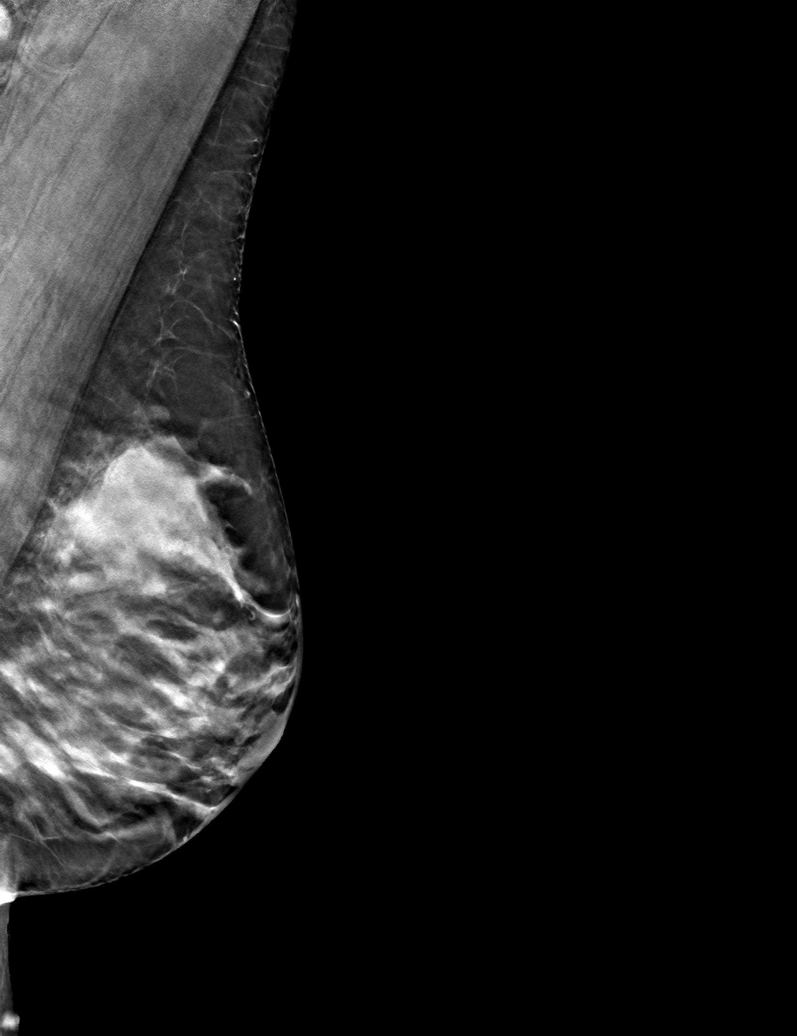

[6 of 30 positions shown; findings below may reference images not displayed]

ACR Breast Density Category c: The breast tissue is heterogeneously
dense, which may obscure small masses.
FINDINGS: A BB has been placed at the upper-outer quadrant of the left breast
indicating the tender palpable site. No definite suspicious findings
are seen deep to the palpable marker, however the surrounding tissue
is extremely dense, and therefore ultrasound will be performed for
further evaluation. No suspicious calcifications, masses or areas of
distortion are seen in the bilateral breasts.

Mammographic images were processed with CAD.

On physical exam, there is a firm ridge of tissue in the upper-outer
left breast without a discrete palpable lump. Mild tenderness is
elicited with palpation.

Targeted ultrasound is performed of the upper-outer left breast,
showing multiple scattered benign-appearing cysts measuring up to 5
mm. No suspicious masses or areas of shadowing are identified.
IMPRESSION: 1. Benign fibrocystic changes noted in the upper-outer left breast
at the palpable tender site.

2.  No mammographic evidence of malignancy in the bilateral breasts.

RECOMMENDATION:
Screening mammogram in one year.(Code:[id number])

I have discussed the findings and recommendations with the patient.
If applicable, a reminder letter will be sent to the patient
regarding the next appointment.

BI-RADS CATEGORY  2: Benign.

## 2022-01-18 ENCOUNTER — Other Ambulatory Visit: Payer: Self-pay | Admitting: Family Medicine

## 2022-02-16 ENCOUNTER — Encounter: Payer: Self-pay | Admitting: Family Medicine

## 2022-02-17 NOTE — Telephone Encounter (Signed)
Dr Volanda Napoleon reviewed, Per Dr Volanda Napoleon direction : Called pt - pt having sx of thyroid disorder - losing hair, gained 3lbs, fatigue, ringing in ear.  Pt taking 180mcg of synthroid. Taking alone without other meds, empty stomach, in the morning, at least 1 hr prior to taking anything else.   Pt still has 153mcg dose of synthroid. Per Dr Volanda Napoleon - pt to start tomorrow AM. Pt advised.  Pt booked for tomorrow at 1140 to discuss levels and plan with Dr Volanda Napoleon.

## 2022-02-18 ENCOUNTER — Encounter: Payer: Self-pay | Admitting: Family Medicine

## 2022-02-18 ENCOUNTER — Ambulatory Visit: Payer: BC Managed Care – PPO | Admitting: Family Medicine

## 2022-02-18 VITALS — BP 116/74 | HR 62 | Temp 98.1°F | Resp 16 | Ht 63.0 in | Wt 131.5 lb

## 2022-02-18 DIAGNOSIS — R232 Flushing: Secondary | ICD-10-CM

## 2022-02-18 DIAGNOSIS — E782 Mixed hyperlipidemia: Secondary | ICD-10-CM

## 2022-02-18 DIAGNOSIS — E039 Hypothyroidism, unspecified: Secondary | ICD-10-CM | POA: Diagnosis not present

## 2022-02-18 MED ORDER — LEVOTHYROXINE SODIUM 125 MCG PO TABS: 125.0000 ug | ORAL_TABLET | Freq: Every day | ORAL | Status: DC

## 2022-02-18 NOTE — Patient Instructions (Addendum)
It was a pleasure meeting you today. Thank you for allowing me to take part in your health care.  Our goals for today as we discussed include:  Increase Levothyroxine to 125 mcg daily  Repeat TSH and Free T4 in 6 weeks  Follow up in 7-8 weeks with PCP  Recommend Tetanus Vaccination.  This is given every 10 years.   Recommend Shingles vaccine.  This is a 2 dose series and can be given at your local pharmacy.  Please talk to your pharmacist about this.      If you have any questions or concerns, please do not hesitate to call the office at [phone].  I look forward to our next visit and until then take care and stay safe.  Regards,   Carollee Leitz, MD   [facility]

## 2022-02-18 NOTE — Assessment & Plan Note (Signed)
Chronic.  ASCVD risk low at 4.1%.   -Continue current lifestyle management, monitoring diet and increasing exercise.

## 2022-02-18 NOTE — Assessment & Plan Note (Signed)
Chronic.  Recent TSH 10.7.  Symptomatic constipation, weight and fatigue.  Compliant with Synthroid daily and taking appropriately. Increase Synthroid 125 mcg's daily Repeat TSH and T4 in 6 weeks. Follow-up in 7 to 8 weeks.

## 2022-02-18 NOTE — Assessment & Plan Note (Signed)
Improved.  Self discontinued estradiol gel.

## 2022-02-18 NOTE — Progress Notes (Signed)
   SUBJECTIVE:   Chief Complaint  Patient presents with   Thyroid Problem   HPI Patient presents to clinic to discuss recent lab work.  Had annual labs drawn for work physical.  TSH elevated at 10.7.  Prior TSH 2.74 on 10/14/2021.  Compliant with levothyroxine 125 mcg daily.  No change in diet or other medications that are interfering with absorption.  Reports recent constipation, fatigue and 3 pound weight gain.  Denies any chest pain, exertional shortness of breath or lower extremity edema.  Hot flashes Self discontinued estradiol gel.  Reports hot flashes tolerable now and less frequent.   PERTINENT PMH / PSH: Hypothyroid Postmenopausal vasomotor symptoms Hyperlipidemia  OBJECTIVE:  BP 116/74   Pulse 62   Temp 98.1 F (36.7 C)   Resp 16   Ht 5\' 3"  (1.6 m)   Wt 131 lb 8 oz (59.6 kg)   SpO2 99%   BMI 23.29 kg/m    Physical Exam Vitals reviewed.  Constitutional:      General: She is not in acute distress.    Appearance: Normal appearance. She is normal weight. She is not ill-appearing, toxic-appearing or diaphoretic.  Eyes:     General:        Right eye: No discharge.        Left eye: No discharge.     Conjunctiva/sclera: Conjunctivae normal.  Neck:     Thyroid: No thyromegaly or thyroid tenderness.  Cardiovascular:     Rate and Rhythm: Normal rate and regular rhythm.     Heart sounds: Normal heart sounds.  Pulmonary:     Effort: Pulmonary effort is normal.     Breath sounds: Normal breath sounds.  Abdominal:     General: Bowel sounds are normal.  Musculoskeletal:     Right lower leg: No edema.     Left lower leg: No edema.  Lymphadenopathy:     Cervical: No cervical adenopathy.  Skin:    General: Skin is warm and dry.  Neurological:     Mental Status: She is alert and oriented to person, place, and time. Mental status is at baseline.  Psychiatric:        Mood and Affect: Mood normal.        Behavior: Behavior normal.        Thought Content: Thought  content normal.        Judgment: Judgment normal.     ASSESSMENT/PLAN:  Hypothyroidism, unspecified type Assessment & Plan: Chronic.  Recent TSH 10.7.  Symptomatic constipation, weight and fatigue.  Compliant with Synthroid daily and taking appropriately. Increase Synthroid 125 mcg's daily Repeat TSH and T4 in 6 weeks. Follow-up in 7 to 8 weeks.   Moderate mixed hyperlipidemia not requiring statin therapy Assessment & Plan: Chronic.  ASCVD risk low at 4.1%.   -Continue current lifestyle management, monitoring diet and increasing exercise.   Hot flashes Assessment & Plan: Improved.  Self discontinued estradiol gel.      PDMP reviewed  Return in about 8 weeks (around 04/15/2022) for PCP.  Carollee Leitz, MD

## 2022-02-26 ENCOUNTER — Other Ambulatory Visit: Payer: Self-pay | Admitting: Family Medicine

## 2022-02-26 DIAGNOSIS — E039 Hypothyroidism, unspecified: Secondary | ICD-10-CM

## 2022-03-10 ENCOUNTER — Encounter: Payer: Self-pay | Admitting: Family Medicine

## 2022-03-11 DIAGNOSIS — Z86010 Personal history of colonic polyps: Secondary | ICD-10-CM | POA: Diagnosis not present

## 2022-03-11 DIAGNOSIS — K582 Mixed irritable bowel syndrome: Secondary | ICD-10-CM | POA: Diagnosis not present

## 2022-04-07 ENCOUNTER — Other Ambulatory Visit: Payer: Self-pay | Admitting: Family Medicine

## 2022-04-07 DIAGNOSIS — Z1231 Encounter for screening mammogram for malignant neoplasm of breast: Secondary | ICD-10-CM

## 2022-04-08 ENCOUNTER — Ambulatory Visit: Payer: BC Managed Care – PPO | Admitting: Family Medicine

## 2022-04-08 ENCOUNTER — Encounter: Payer: Self-pay | Admitting: Family Medicine

## 2022-04-08 DIAGNOSIS — M1990 Unspecified osteoarthritis, unspecified site: Secondary | ICD-10-CM | POA: Insufficient documentation

## 2022-04-08 DIAGNOSIS — E039 Hypothyroidism, unspecified: Secondary | ICD-10-CM

## 2022-04-08 DIAGNOSIS — M19071 Primary osteoarthritis, right ankle and foot: Secondary | ICD-10-CM | POA: Insufficient documentation

## 2022-04-08 DIAGNOSIS — Z8639 Personal history of other endocrine, nutritional and metabolic disease: Secondary | ICD-10-CM | POA: Insufficient documentation

## 2022-04-08 MED ORDER — LEVOTHYROXINE SODIUM 125 MCG PO TABS: 125.0000 ug | ORAL_TABLET | Freq: Every day | ORAL | 3 refills | Status: DC

## 2022-04-08 NOTE — Progress Notes (Addendum)
SUBJECTIVE:   Chief Complaint  Patient presents with   Medical Management of Chronic Issues    7-8 week follow up   HPI Patient presents to clinic for follow-up thyroid disease.  Hypothyroidism. Doing well on current dose of levothyroxine 125 mcg daily.  No significant symptoms.  Recent TSH 4.310, T4 1.50.  Thyroid antiglobulin antibody was added to labs by occupational NP and noted to be elevated at 12.1.  Patient concerned for elevated TGA.  Reports no prior thyroid ultrasounds.  No history of thyroid cancers or family history.  PERTINENT PMH / PSH: Hypothyroid  OBJECTIVE:  BP 118/78   Pulse 64   Temp 98 F (36.7 C) (Oral)   Ht 5\' 3"  (1.6 m)   Wt 130 lb 3.2 oz (59.1 kg)   SpO2 97%   BMI 23.06 kg/m    Physical Exam Vitals reviewed.  Constitutional:      General: She is not in acute distress.    Appearance: Normal appearance. She is normal weight. She is not ill-appearing, toxic-appearing or diaphoretic.  HENT:     Head: Normocephalic.     Right Ear: Tympanic membrane, ear canal and external ear normal.     Left Ear: Tympanic membrane, ear canal and external ear normal.     Nose: Nose normal.     Mouth/Throat:     Mouth: Mucous membranes are moist.  Eyes:     General:        Right eye: No discharge.        Left eye: No discharge.     Conjunctiva/sclera: Conjunctivae normal.  Neck:     Thyroid: No thyroid mass, thyromegaly or thyroid tenderness.  Cardiovascular:     Rate and Rhythm: Normal rate and regular rhythm.     Heart sounds: Normal heart sounds.  Pulmonary:     Effort: Pulmonary effort is normal.     Breath sounds: Normal breath sounds.  Abdominal:     General: Bowel sounds are normal.     Palpations: Abdomen is soft.  Musculoskeletal:        General: Normal range of motion.  Lymphadenopathy:     Cervical: No cervical adenopathy.  Skin:    General: Skin is warm and dry.  Neurological:     General: No focal deficit present.     Mental Status:  She is alert and oriented to person, place, and time. Mental status is at baseline.  Psychiatric:        Mood and Affect: Mood normal.        Behavior: Behavior normal.        Thought Content: Thought content normal.        Judgment: Judgment normal.     ASSESSMENT/PLAN:  Hypothyroidism, unspecified type Assessment & Plan: Chronic.  Recent TSH decreased to 4.31 from 10.7.  Symptoms have decreased but still remain. Thyroglobulin antibody elevated at 12.1.  Normal thyroid exam without adenopathy.  Discussed with patient no indication at this time for thyroid ultrasound.  Patient remains concerned.  Will order thyroid ultrasound for confirmation. Continue Synthroid 125 mcg daily.  Refill sent  Repeat TSH only in 6 weeks.  Follow-up in 7 weeks.  Orders: -     Levothyroxine Sodium; Take 1 tablet (125 mcg total) by mouth daily before breakfast.  Dispense: 90 tablet; Refill: 3 -     US THYROID; Future -     TSH; Future   PDMP reviewed  Return in 7 weeks (on 05/27/2022) for  PCP.  Carollee Leitz, MD

## 2022-04-08 NOTE — Patient Instructions (Addendum)
It was a pleasure meeting you today. Thank you for allowing me to take part in your health care.  Our goals for today as we discussed include:  Refill sent for your thyroid medication  Sending referral for imaging of thyroid  Repeat TSH in 6 weeks  Follow-up with PCP in 7-8  weeks  Recommend Tetanus Vaccination.  This is given every 10 years.   Recommend Shingles vaccine.  This is a 2 dose series and can be given at your local pharmacy.  Please talk to your pharmacist about this.   If you have any questions or concerns, please do not hesitate to call the office at [phone].  I look forward to our next visit and until then take care and stay safe.  Regards,   Carollee Leitz, MD   [facility]

## 2022-04-11 ENCOUNTER — Encounter: Payer: Self-pay | Admitting: Family Medicine

## 2022-04-11 NOTE — Assessment & Plan Note (Signed)
Chronic.  Recent TSH decreased to 4.31 from 10.7.  Symptoms have decreased but still remain. Thyroglobulin antibody elevated at 12.1.  Normal thyroid exam without adenopathy.  Discussed with patient no indication at this time for thyroid ultrasound.  Patient remains concerned.  Will order thyroid ultrasound for confirmation. Continue Synthroid 125 mcg daily.  Refill sent  Repeat TSH only in 6 weeks.  Follow-up in 7 weeks.

## 2022-04-15 ENCOUNTER — Ambulatory Visit
Admission: RE | Admit: 2022-04-15 | Discharge: 2022-04-15 | Disposition: A | Discharge status: Home / Self Care | Payer: BC Managed Care – PPO | Source: Ambulatory Visit | Attending: Family Medicine | Admitting: Family Medicine

## 2022-04-15 DIAGNOSIS — E039 Hypothyroidism, unspecified: Secondary | ICD-10-CM | POA: Diagnosis not present

## 2022-04-19 ENCOUNTER — Encounter: Payer: Self-pay | Admitting: Family Medicine

## 2022-05-22 ENCOUNTER — Ambulatory Visit
Admission: RE | Admit: 2022-05-22 | Discharge: 2022-05-22 | Disposition: A | Discharge status: Home / Self Care | Payer: BC Managed Care – PPO | Source: Ambulatory Visit | Attending: Family Medicine | Admitting: Family Medicine

## 2022-05-22 DIAGNOSIS — Z1231 Encounter for screening mammogram for malignant neoplasm of breast: Secondary | ICD-10-CM | POA: Diagnosis not present

## 2022-05-27 ENCOUNTER — Ambulatory Visit: Payer: BC Managed Care – PPO | Admitting: Family Medicine

## 2022-05-27 ENCOUNTER — Encounter: Payer: Self-pay | Admitting: Family Medicine

## 2022-05-27 VITALS — BP 108/68 | HR 64 | Ht 63.0 in | Wt 132.0 lb

## 2022-05-27 DIAGNOSIS — E039 Hypothyroidism, unspecified: Secondary | ICD-10-CM | POA: Diagnosis not present

## 2022-05-27 NOTE — Patient Instructions (Addendum)
It was a pleasure meeting you today. Thank you for allowing me to take part in your health care.  Our goals for today as we discussed include:  Continue levothyroxine 125 mcg daily Repeat TSH in 6 months  Recommend Tetanus Vaccination.  This is given every 10 years.   Recommend Shingles vaccine.  This is a 2 dose series and can be given at your local pharmacy.  Please talk to your pharmacist about this.   Mammogram due 05/2023 Colonoscopy due 03/2029  Follow up in 6 months  If you have any questions or concerns, please do not hesitate to call the office at [phone].  I look forward to our next visit and until then take care and stay safe.  Regards,   Dana Allan, MD   [facility]

## 2022-05-27 NOTE — Progress Notes (Signed)
   SUBJECTIVE:   Chief Complaint  Patient presents with   Medical Management of Chronic Issues   HPI Patient presents to clinic for follow-up thyroid disease.  Hypothyroidism. Doing well.  Compliant with levothyroxine 125 mcg daily.  Asymptomatic.  Recent ultrasound thyroid consistent with previous thyroiditis and current medical thyroid disease.  Recent TSH 4.20   PERTINENT PMH / PSH: Hypothyroid  OBJECTIVE:  BP 108/68   Pulse 64   Ht 5\' 3"  (1.6 m)   Wt 132 lb (59.9 kg)   SpO2 95%   BMI 23.38 kg/m    Physical Exam Vitals reviewed.  Constitutional:      General: She is not in acute distress.    Appearance: Normal appearance. She is normal weight. She is not ill-appearing, toxic-appearing or diaphoretic.  Eyes:     General:        Right eye: No discharge.        Left eye: No discharge.     Conjunctiva/sclera: Conjunctivae normal.  Neck:     Thyroid: No thyromegaly or thyroid tenderness.  Cardiovascular:     Rate and Rhythm: Normal rate and regular rhythm.     Heart sounds: Normal heart sounds.  Pulmonary:     Effort: Pulmonary effort is normal.     Breath sounds: Normal breath sounds.  Musculoskeletal:        General: Normal range of motion.  Skin:    General: Skin is warm and dry.  Neurological:     Mental Status: She is alert and oriented to person, place, and time. Mental status is at baseline.  Psychiatric:        Mood and Affect: Mood normal.        Behavior: Behavior normal.        Thought Content: Thought content normal.        Judgment: Judgment normal.     ASSESSMENT/PLAN:  Hypothyroidism, unspecified type Assessment & Plan: Chronic.  Stable.  Recent TSH within normal limits. Continue levothyroxine 125 mcg daily Follow-up in 6 months   Orders: -     TSH  HCM Recommend tetanus booster Recommend shingles vaccine Mammogram up-to-date.  Due 03/26 Colonoscopy up-to-date.  Due 03/31 DEXA completed 08/23.  T-score -1.4.  Osteopenia.  FRAX  7.6% and 0.7%  PDMP reviewed  Return in about 6 months (around 11/27/2022) for PCP.  Dana Allan, MD

## 2022-05-27 NOTE — Assessment & Plan Note (Addendum)
Chronic.  Stable.  Recent TSH within normal limits. Continue levothyroxine 125 mcg daily Follow-up in 6 months

## 2022-07-17 DIAGNOSIS — M25541 Pain in joints of right hand: Secondary | ICD-10-CM | POA: Diagnosis not present

## 2022-07-17 DIAGNOSIS — M25542 Pain in joints of left hand: Secondary | ICD-10-CM | POA: Diagnosis not present

## 2022-07-17 DIAGNOSIS — S63042A Subluxation of carpometacarpal joint of left thumb, initial encounter: Secondary | ICD-10-CM | POA: Diagnosis not present

## 2022-07-17 DIAGNOSIS — S63041A Subluxation of carpometacarpal joint of right thumb, initial encounter: Secondary | ICD-10-CM | POA: Diagnosis not present

## 2022-08-11 DIAGNOSIS — D225 Melanocytic nevi of trunk: Secondary | ICD-10-CM | POA: Diagnosis not present

## 2022-08-11 DIAGNOSIS — L821 Other seborrheic keratosis: Secondary | ICD-10-CM | POA: Diagnosis not present

## 2022-08-11 DIAGNOSIS — L659 Nonscarring hair loss, unspecified: Secondary | ICD-10-CM | POA: Diagnosis not present

## 2022-10-26 DIAGNOSIS — D235 Other benign neoplasm of skin of trunk: Secondary | ICD-10-CM | POA: Diagnosis not present

## 2022-10-26 DIAGNOSIS — D485 Neoplasm of uncertain behavior of skin: Secondary | ICD-10-CM | POA: Diagnosis not present

## 2022-11-30 ENCOUNTER — Encounter: Payer: Self-pay | Admitting: Family Medicine

## 2022-11-30 ENCOUNTER — Ambulatory Visit: Payer: BC Managed Care – PPO | Admitting: Family Medicine

## 2022-11-30 VITALS — BP 114/66 | HR 63 | Temp 98.2°F | Resp 16 | Ht 63.0 in | Wt 131.0 lb

## 2022-11-30 DIAGNOSIS — Z8719 Personal history of other diseases of the digestive system: Secondary | ICD-10-CM

## 2022-11-30 DIAGNOSIS — E039 Hypothyroidism, unspecified: Secondary | ICD-10-CM

## 2022-11-30 NOTE — Assessment & Plan Note (Signed)
Recent episode treated with Cipro and Flagyl. No current symptoms. -Follow-up with GI on 12/10/2022 for ongoing constipation and abdominal pain. -Continue current treatment as prescribed by GI.

## 2022-11-30 NOTE — Patient Instructions (Addendum)
It was a pleasure meeting you today. Thank you for allowing me to take part in your health care.  Our goals for today as we discussed include:  Check thyroid level today  Follow up as needed  Follow up for annual physical in 6 months  This is a list of the screening recommended for you and due dates:  Health Maintenance  Topic Date Due   DTaP/Tdap/Td vaccine (1 - Tdap) Never done   Pap with HPV screening  Never done   Zoster (Shingles) Vaccine (1 of 2) Never done   Flu Shot  Never done   COVID-19 Vaccine (3 - 2023-24 season) 09/20/2022   Mammogram  05/22/2023   Colon Cancer Screening  04/06/2029   HPV Vaccine  Aged Out   Hepatitis C Screening  Discontinued   HIV Screening  Discontinued      If you have any questions or concerns, please do not hesitate to call the office at [phone].  I look forward to our next visit and until then take care and stay safe.  Regards,   Dana Allan, MD   [facility]

## 2022-11-30 NOTE — Assessment & Plan Note (Signed)
Stable on Synthroid daily. No symptoms of hyper- or hypothyroidism. -Check TSH today to assess for need of dose adjustment. -Continue Synthroid daily.

## 2022-11-30 NOTE — Progress Notes (Signed)
SUBJECTIVE:   Chief Complaint  Patient presents with   Thyroid Problem    6 month follow up   HPI Presents for follow up hypothyroid  Discussed the use of AI scribe software for clinical note transcription with the patient, who gave verbal consent to proceed.  History of Present Illness The patient, a 62 year old with a history of hypothyroidism, presents for a follow-up visit regarding her thyroid condition. She reports no adverse symptoms since the adjustment to 125 mcg of Synthroid, denying chest pain and heart palpitations. The patient has a chronic issue with constipation, which has not worsened.  Approximately four weeks ago, the patient experienced a bout of diverticulitis, for which she was treated with Ciprofloxacin and Metronidazole. The patient reports a significant episode of abdominal pain during this period, which occurred while straining in the bathroom. This episode prompted her to contact her gastroenterologist, and she is scheduled for a follow-up visit on November 21st.  The patient denies any blood in her stool and is not currently experiencing any abdominal pain. She also reports occasional rectal spasms, typically at night, which are managed with sublingual Tyrosine as needed. The patient suspects she may have an internal hemorrhoid, as topical application of Preparation H often provides relief.   PERTINENT PMH / PSH: As above  OBJECTIVE:  BP 114/66   Pulse 63   Temp 98.2 F (36.8 C)   Resp 16   Ht 5\' 3"  (1.6 m)   Wt 131 lb (59.4 kg)   SpO2 94%   BMI 23.21 kg/m    Physical Exam Vitals reviewed.  Constitutional:      General: She is not in acute distress.    Appearance: Normal appearance. She is normal weight. She is not ill-appearing, toxic-appearing or diaphoretic.  Eyes:     General:        Right eye: No discharge.        Left eye: No discharge.     Conjunctiva/sclera: Conjunctivae normal.  Cardiovascular:     Rate and Rhythm: Normal rate and  regular rhythm.     Heart sounds: Normal heart sounds.  Pulmonary:     Effort: Pulmonary effort is normal.     Breath sounds: Normal breath sounds.  Abdominal:     General: Bowel sounds are normal.  Musculoskeletal:        General: Normal range of motion.  Skin:    General: Skin is warm and dry.  Neurological:     General: No focal deficit present.     Mental Status: She is alert and oriented to person, place, and time. Mental status is at baseline.  Psychiatric:        Mood and Affect: Mood normal.        Behavior: Behavior normal.        Thought Content: Thought content normal.        Judgment: Judgment normal.        11/30/2022    8:07 AM 05/27/2022   10:20 AM 04/08/2022    8:23 AM 02/18/2022   11:32 AM 09/16/2021    8:22 AM  Depression screen PHQ 2/9  Decreased Interest 0 1 0 0 0  Down, Depressed, Hopeless 0 0 0 0 0  PHQ - 2 Score 0 1 0 0 0  Altered sleeping 0 0  0   Tired, decreased energy 0 1  2   Change in appetite 0 1  0   Feeling bad or failure about yourself  0  0  0   Trouble concentrating 0 1  0   Moving slowly or fidgety/restless 0 0  0   Suicidal thoughts 0 0  0   PHQ-9 Score 0 4  2   Difficult doing work/chores Not difficult at all Not difficult at all  Not difficult at all       11/30/2022    8:07 AM 05/27/2022   10:21 AM 04/08/2022    8:23 AM 02/18/2022   11:32 AM  GAD 7 : Generalized Anxiety Score  Nervous, Anxious, on Edge 0 0 0 0  Control/stop worrying 0 0 0 0  Worry too much - different things 0 0 0 0  Trouble relaxing 0 0 0 0  Restless 0 0 0 0  Easily annoyed or irritable 0 0 0 0  Afraid - awful might happen 0 0 0 0  Total GAD 7 Score 0 0 0 0  Anxiety Difficulty Not difficult at all Not difficult at all Not difficult at all Not difficult at all    ASSESSMENT/PLAN:  Hypothyroidism, unspecified type Assessment & Plan: Stable on Synthroid daily. No symptoms of hyper- or hypothyroidism. -Check TSH today to assess for need of dose  adjustment. -Continue Synthroid daily.  Orders: -     TSH  History of diverticulitis Assessment & Plan: Recent episode treated with Cipro and Flagyl. No current symptoms. -Follow-up with GI on 12/10/2022 for ongoing constipation and abdominal pain. -Continue current treatment as prescribed by GI.    General Health Maintenance -Flu vaccine administered in late September/early October 2024. -Annual due in January 2025. -Dexa scan due at age 6.   PDMP reviewed  Return in about 6 months (around 05/30/2023), or if symptoms worsen or fail to improve, for PCP, annual physical.  Dana Allan, MD

## 2022-12-01 LAB — TSH: TSH: 3.6 u[IU]/mL (ref 0.450–4.500)

## 2022-12-10 DIAGNOSIS — K5790 Diverticulosis of intestine, part unspecified, without perforation or abscess without bleeding: Secondary | ICD-10-CM | POA: Diagnosis not present

## 2022-12-10 DIAGNOSIS — R1031 Right lower quadrant pain: Secondary | ICD-10-CM | POA: Diagnosis not present

## 2023-01-15 DIAGNOSIS — M2021 Hallux rigidus, right foot: Secondary | ICD-10-CM | POA: Diagnosis not present

## 2023-02-15 ENCOUNTER — Ambulatory Visit: Payer: BC Managed Care – PPO

## 2023-02-15 DIAGNOSIS — D122 Benign neoplasm of ascending colon: Secondary | ICD-10-CM | POA: Diagnosis not present

## 2023-02-15 DIAGNOSIS — K635 Polyp of colon: Secondary | ICD-10-CM | POA: Diagnosis not present

## 2023-02-15 DIAGNOSIS — R197 Diarrhea, unspecified: Secondary | ICD-10-CM | POA: Diagnosis not present

## 2023-02-15 DIAGNOSIS — D126 Benign neoplasm of colon, unspecified: Secondary | ICD-10-CM | POA: Diagnosis not present

## 2023-02-15 DIAGNOSIS — K5730 Diverticulosis of large intestine without perforation or abscess without bleeding: Secondary | ICD-10-CM | POA: Diagnosis not present

## 2023-02-26 LAB — CMP12+LP+TP+TSH+6AC+CBC/D/PLT
A1c: 5.6
EGFR: 59
Free T4: 9.6 ng/dL
TSH: 1.32 (ref 0.41–5.90)

## 2023-03-01 ENCOUNTER — Encounter: Payer: Self-pay | Admitting: Family Medicine

## 2023-03-05 ENCOUNTER — Ambulatory Visit: Payer: BC Managed Care – PPO | Admitting: Family Medicine

## 2023-03-17 ENCOUNTER — Encounter: Payer: Self-pay | Admitting: Family Medicine

## 2023-03-17 ENCOUNTER — Ambulatory Visit: Payer: BC Managed Care – PPO | Admitting: Family Medicine

## 2023-03-17 VITALS — BP 124/72 | HR 60 | Temp 98.3°F | Resp 18 | Ht 63.0 in | Wt 132.5 lb

## 2023-03-17 DIAGNOSIS — E039 Hypothyroidism, unspecified: Secondary | ICD-10-CM

## 2023-03-17 DIAGNOSIS — E782 Mixed hyperlipidemia: Secondary | ICD-10-CM | POA: Diagnosis not present

## 2023-03-17 DIAGNOSIS — Z136 Encounter for screening for cardiovascular disorders: Secondary | ICD-10-CM

## 2023-03-17 DIAGNOSIS — K582 Mixed irritable bowel syndrome: Secondary | ICD-10-CM

## 2023-03-17 NOTE — Patient Instructions (Addendum)
 It was a pleasure meeting you today. Thank you for allowing me to take part in your health care.  Our goals for today as we discussed include:  Take Miralax 2 scoops daily Take Senna at night Start Metamucil daily to increase fiber  If no improvement can trial Lactulose.  Please notify MD and will send in prescription   Calcium score/ Cardiac CT.  This is a 3D image of the heart that measures the calcium deposits in the coronary arteries to determine risk of heart disease. It is a $99 self-pay exam . The imaging is done at [facility].     This is a list of the screening recommended for you and due dates:  Health Maintenance  Topic Date Due   DTaP/Tdap/Td vaccine (1 - Tdap) Never done   Pap with HPV screening  Never done   Zoster (Shingles) Vaccine (1 of 2) Never done   COVID-19 Vaccine (3 - 2024-25 season) 09/20/2022   Mammogram  05/22/2023   Colon Cancer Screening  02/18/2033   Flu Shot  Completed   HPV Vaccine  Aged Out   Hepatitis C Screening  Discontinued   HIV Screening  Discontinued     If you have any questions or concerns, please do not hesitate to call the office at [phone].  I look forward to our next visit and until then take care and stay safe.  Regards,   Dana Allan, MD   [facility]

## 2023-03-17 NOTE — Progress Notes (Signed)
 SUBJECTIVE:   Chief Complaint  Patient presents with   Abnormal Lab   HPI Presents for review of labs  Discussed the use of AI scribe software for clinical note transcription with the patient, who gave verbal consent to proceed.  History of Present Illness Kathleen Sims is a 63 year old female who presents for follow-up on blood work.  She is here for a follow-up on her blood work, specifically concerning her cholesterol levels. Her LDL cholesterol has increased to 170 mg/dL. She is not currently on cholesterol medication. There is a family history of cardiovascular issues, including a sister who had a stroke at 57, and both her brother and father having undergone bypass surgery despite having low cholesterol levels.  She has a history of hypothyroidism and is currently on a stable dose of 125 mcg of thyroid medication. Her thyroid levels are reportedly stable, and she is due for a recheck in six months.  She experiences gastrointestinal issues, including a history of irritable bowel syndrome and diverticulitis. She reports sharp, localized pain in the right lower quadrant, which she associates with gas and constipation. She has a history of a colonoscopy a month ago, which revealed a non-cancerous polyp. She takes Miralax every other day and Senna tablets as needed to manage her bowel movements, which she describes as small and frequent.  In terms of her social history, she mentions being active and on the go, and she drinks water regularly from a [employer] cup. She consumes coffee and is mindful of her diet, avoiding ice cream and limiting bread intake.      PERTINENT PMH / PSH: As above  OBJECTIVE:  BP 124/72   Pulse 60   Temp 98.3 F (36.8 C)   Resp 18   Ht 5\' 3"  (1.6 m)   Wt 132 lb 8 oz (60.1 kg)   SpO2 97%   BMI 23.47 kg/m    Physical Exam Vitals reviewed.  Constitutional:      General: She is not in acute distress.    Appearance: Normal appearance. She is normal  weight. She is not ill-appearing, toxic-appearing or diaphoretic.  HENT:     Mouth/Throat:     Mouth: Mucous membranes are moist.  Eyes:     General:        Right eye: No discharge.        Left eye: No discharge.     Conjunctiva/sclera: Conjunctivae normal.  Cardiovascular:     Rate and Rhythm: Normal rate.  Pulmonary:     Effort: Pulmonary effort is normal.  Abdominal:     General: Abdomen is flat. Bowel sounds are normal. There is no distension.     Palpations: Abdomen is soft. There is mass.     Tenderness: There is abdominal tenderness. There is no right CVA tenderness, left CVA tenderness, guarding or rebound.  Skin:    General: Skin is warm and dry.  Neurological:     General: No focal deficit present.     Mental Status: She is alert and oriented to person, place, and time. Mental status is at baseline.  Psychiatric:        Mood and Affect: Mood normal.        Behavior: Behavior normal.        Thought Content: Thought content normal.        Judgment: Judgment normal.           03/17/2023    7:56 AM 11/30/2022  8:07 AM 05/27/2022   10:20 AM 04/08/2022    8:23 AM 02/18/2022   11:32 AM  Depression screen PHQ 2/9  Decreased Interest 0 0 1 0 0  Down, Depressed, Hopeless 0 0 0 0 0  PHQ - 2 Score 0 0 1 0 0  Altered sleeping 0 0 0  0  Tired, decreased energy 0 0 1  2  Change in appetite 0 0 1  0  Feeling bad or failure about yourself  0 0 0  0  Trouble concentrating 0 0 1  0  Moving slowly or fidgety/restless 0 0 0  0  Suicidal thoughts 0 0 0  0  PHQ-9 Score 0 0 4  2  Difficult doing work/chores Not difficult at all Not difficult at all Not difficult at all  Not difficult at all      03/17/2023    7:57 AM 11/30/2022    8:07 AM 05/27/2022   10:21 AM 04/08/2022    8:23 AM  GAD 7 : Generalized Anxiety Score  Nervous, Anxious, on Edge 0 0 0 0  Control/stop worrying 0 0 0 0  Worry too much - different things 0 0 0 0  Trouble relaxing 0 0 0 0  Restless 0 0 0 0   Easily annoyed or irritable 0 0 0 0  Afraid - awful might happen 0 0 0 0  Total GAD 7 Score 0 0 0 0  Anxiety Difficulty Not difficult at all Not difficult at all Not difficult at all Not difficult at all    ASSESSMENT/PLAN:  Hypothyroidism, unspecified type Assessment & Plan: Stable on current dose of levothyroxine. -Continue current dose of levothyroxine 125 mcg daily -Recheck thyroid function in 6 months.  Orders: -     TSH  Screening for ischemic heart disease Assessment & Plan: Sister had a stroke at age 59, father and brother had bypass surgery. Elevated LDL -Consider this in risk assessment for cardiovascular disease.   Orders: -     CT CARDIAC SCORING (SELF PAY ONLY)  Moderate mixed hyperlipidemia not requiring statin therapy Assessment & Plan: LDL 170, creeping up. Discussed the risks/benefits of statin therapy vs lifestyle modifications. Also discussed the option of a coronary CT scan to assess for calcium buildup in the coronary vessels. -Order coronary CT scan  -Consider lifestyle modifications to lower LDL. -Consider statin therapy if LDL continues to rise or if coronary CT scan shows significant calcium buildup.   Irritable bowel syndrome with both constipation and diarrhea Assessment & Plan: Recurrent right lower quadrant pain, possibly related to constipation. No signs of obstruction or infection. Colonoscopy was normal. Suspect constipation.  No signs of acute abdomen on exam. -Increase fiber intake. -Take Miralax 2 scoops daily and Senna at night until bowel movements improve. -Consider lactulose if no improvement in a week. -Notify provider if symptoms worsen or if there is blood in stool.     PDMP reviewed  Return in about 12 days (around 03/29/2023) for PCP.  Dana Allan, MD

## 2023-03-21 ENCOUNTER — Encounter: Payer: Self-pay | Admitting: Family Medicine

## 2023-03-21 DIAGNOSIS — Z136 Encounter for screening for cardiovascular disorders: Secondary | ICD-10-CM | POA: Insufficient documentation

## 2023-03-21 NOTE — Assessment & Plan Note (Signed)
 Recurrent right lower quadrant pain, possibly related to constipation. No signs of obstruction or infection. Colonoscopy was normal. Suspect constipation.  No signs of acute abdomen on exam. -Increase fiber intake. -Take Miralax 2 scoops daily and Senna at night until bowel movements improve. -Consider lactulose if no improvement in a week. -Notify provider if symptoms worsen or if there is blood in stool.

## 2023-03-21 NOTE — Assessment & Plan Note (Signed)
 LDL 170, creeping up. Discussed the risks/benefits of statin therapy vs lifestyle modifications. Also discussed the option of a coronary CT scan to assess for calcium buildup in the coronary vessels. -Order coronary CT scan  -Consider lifestyle modifications to lower LDL. -Consider statin therapy if LDL continues to rise or if coronary CT scan shows significant calcium buildup.

## 2023-03-21 NOTE — Assessment & Plan Note (Signed)
 Stable on current dose of levothyroxine. -Continue current dose of levothyroxine 125 mcg daily -Recheck thyroid function in 6 months.

## 2023-03-21 NOTE — Assessment & Plan Note (Signed)
 Sister had a stroke at age 64, father and brother had bypass surgery. Elevated LDL -Consider this in risk assessment for cardiovascular disease.

## 2023-03-22 ENCOUNTER — Encounter: Payer: Self-pay | Admitting: Family Medicine

## 2023-03-25 ENCOUNTER — Ambulatory Visit
Admission: RE | Admit: 2023-03-25 | Discharge: 2023-03-25 | Disposition: A | Discharge status: Home / Self Care | Payer: Self-pay | Source: Ambulatory Visit | Attending: Family Medicine | Admitting: Family Medicine

## 2023-03-25 DIAGNOSIS — Z136 Encounter for screening for cardiovascular disorders: Secondary | ICD-10-CM | POA: Insufficient documentation

## 2023-03-29 ENCOUNTER — Ambulatory Visit: Payer: BC Managed Care – PPO | Admitting: Family Medicine

## 2023-03-30 ENCOUNTER — Ambulatory Visit: Payer: BC Managed Care – PPO | Admitting: Family Medicine

## 2023-04-02 ENCOUNTER — Other Ambulatory Visit: Payer: Self-pay | Admitting: Family Medicine

## 2023-04-02 ENCOUNTER — Ambulatory Visit: Payer: BC Managed Care – PPO | Admitting: Family Medicine

## 2023-04-02 ENCOUNTER — Encounter: Payer: Self-pay | Admitting: Family Medicine

## 2023-04-02 VITALS — BP 108/62 | HR 63 | Temp 98.7°F | Resp 20 | Ht 63.0 in | Wt 133.0 lb

## 2023-04-02 DIAGNOSIS — E039 Hypothyroidism, unspecified: Secondary | ICD-10-CM

## 2023-04-02 DIAGNOSIS — K582 Mixed irritable bowel syndrome: Secondary | ICD-10-CM

## 2023-04-02 NOTE — Patient Instructions (Addendum)
 It was a pleasure meeting you today. Thank you for allowing me to take part in your health care.  Our goals for today as we discussed include:  Glad you are feeling better  Continue current management  Follow up in May as scheduled for annual   This is a list of the screening recommended for you and due dates:  Health Maintenance  Topic Date Due   DTaP/Tdap/Td vaccine (1 - Tdap) Never done   Zoster (Shingles) Vaccine (1 of 2) Never done   COVID-19 Vaccine (3 - 2024-25 season) 09/20/2022   Mammogram  05/22/2023   Colon Cancer Screening  02/18/2033   Flu Shot  Completed   HPV Vaccine  Aged Out   Hepatitis C Screening  Discontinued   HIV Screening  Discontinued     If you have any questions or concerns, please do not hesitate to call the office at [phone].  I look forward to our next visit and until then take care and stay safe.  Regards,   Dana Allan, MD   [facility]

## 2023-04-02 NOTE — Progress Notes (Signed)
 SUBJECTIVE:   Chief Complaint  Patient presents with   Abdominal Pain    12 day follow up   HPI Presents for follow up constipation  Discussed the use of AI scribe software for clinical note transcription with the patient, who gave verbal consent to proceed.  History of Present Illness Kathleen Sims is a 63 year old female who presents with abdominal pain and constipation.  The abdominal pain has resolved since she increased her physical activity and adjusted her medication. She humorously describes her stomach as being very loud, comparing it to a recent colonoscopy experience. She has daily bowel movements, which are generally good, although some days are better than others. She had been taking Miralax but has reduced the dosage as her bowel movements have improved. She was previously diagnosed with a redundant and tortuous bowel, which may contribute to her occasional constipation. No abdominal pain or blood in her stool recently.  She recently underwent a CT scan, which showed no calcium in the coronary arteries, and she was informed that there was no heart defect. She is awaiting the lung portion of the scan results due to delays caused by a shortage of radiologists. She mentions living with a smoker in the past, which raises her concern about lung health, although she currently feels fine.      PERTINENT PMH / PSH: As above  OBJECTIVE:  BP 108/62   Pulse 63   Temp 98.7 F (37.1 C)   Resp 20   Ht 5\' 3"  (1.6 m)   Wt 133 lb (60.3 kg)   SpO2 95%   BMI 23.56 kg/m    Physical Exam Vitals reviewed.  Constitutional:      Appearance: She is normal weight.  Abdominal:     General: Abdomen is flat.     Palpations: Abdomen is soft.     Tenderness: There is no abdominal tenderness.           04/02/2023    8:17 AM 03/17/2023    7:56 AM 11/30/2022    8:07 AM 05/27/2022   10:20 AM 04/08/2022    8:23 AM  Depression screen PHQ 2/9  Decreased Interest 0 0 0 1 0  Down,  Depressed, Hopeless 0 0 0 0 0  PHQ - 2 Score 0 0 0 1 0  Altered sleeping 0 0 0 0   Tired, decreased energy 0 0 0 1   Change in appetite 0 0 0 1   Feeling bad or failure about yourself  0 0 0 0   Trouble concentrating 0 0 0 1   Moving slowly or fidgety/restless 0 0 0 0   Suicidal thoughts 0 0 0 0   PHQ-9 Score 0 0 0 4   Difficult doing work/chores Not difficult at all Not difficult at all Not difficult at all Not difficult at all       04/02/2023    8:17 AM 03/17/2023    7:57 AM 11/30/2022    8:07 AM 05/27/2022   10:21 AM  GAD 7 : Generalized Anxiety Score  Nervous, Anxious, on Edge 0 0 0 0  Control/stop worrying 0 0 0 0  Worry too much - different things 0 0 0 0  Trouble relaxing 0 0 0 0  Restless 0 0 0 0  Easily annoyed or irritable 0 0 0 0  Afraid - awful might happen 0 0 0 0  Total GAD 7 Score 0 0 0 0  Anxiety Difficulty Not  difficult at all Not difficult at all Not difficult at all Not difficult at all    ASSESSMENT/PLAN:  Irritable bowel syndrome with both constipation and diarrhea Assessment & Plan: Resolved Abdominal pain likely due to constipation from redundant and tortuous bowel. Miralax effective. Metamucil suggested for fiber and cholesterol. - Advise Miralax as needed to prevent constipation. - Recommend daily Metamucil for fiber intake and cholesterol management. - Monitor bowel movements and adjust Miralax and Metamucil intake accordingly. - Advise avoiding gassy foods and consider Beano or Gas X for gas relief. - Continue healthy heart diet. - Ensure adequate hydration.    Hypothyroidism, unspecified type Assessment & Plan: Thyroid condition well-managed. - Continue current thyroid management plan. - Follow up on thyroid function in six weeks.    PDMP reviewed  Return in about 8 weeks (around 05/31/2023) for PCP.  Dana Allan, MD

## 2023-04-07 ENCOUNTER — Encounter: Payer: Self-pay | Admitting: Family Medicine

## 2023-04-07 NOTE — Assessment & Plan Note (Signed)
 Thyroid condition well-managed. - Continue current thyroid management plan. - Follow up on thyroid function in six weeks.

## 2023-04-07 NOTE — Assessment & Plan Note (Addendum)
 Resolved Abdominal pain likely due to constipation from redundant and tortuous bowel. Miralax effective. Metamucil suggested for fiber and cholesterol. - Advise Miralax as needed to prevent constipation. - Recommend daily Metamucil for fiber intake and cholesterol management. - Monitor bowel movements and adjust Miralax and Metamucil intake accordingly. - Advise avoiding gassy foods and consider Beano or Gas X for gas relief. - Continue healthy heart diet. - Ensure adequate hydration.

## 2023-04-13 ENCOUNTER — Other Ambulatory Visit: Payer: Self-pay | Admitting: Family Medicine

## 2023-04-13 DIAGNOSIS — Z1231 Encounter for screening mammogram for malignant neoplasm of breast: Secondary | ICD-10-CM

## 2023-05-06 DIAGNOSIS — M2021 Hallux rigidus, right foot: Secondary | ICD-10-CM | POA: Diagnosis not present

## 2023-05-06 DIAGNOSIS — M79671 Pain in right foot: Secondary | ICD-10-CM | POA: Diagnosis not present

## 2023-05-24 DIAGNOSIS — Z0189 Encounter for other specified special examinations: Secondary | ICD-10-CM | POA: Diagnosis not present

## 2023-05-24 LAB — TSH: TSH: 1.91 (ref 0.41–5.90)

## 2023-05-26 ENCOUNTER — Ambulatory Visit
Admission: RE | Admit: 2023-05-26 | Discharge: 2023-05-26 | Disposition: A | Discharge status: Home / Self Care | Source: Ambulatory Visit | Attending: Family Medicine | Admitting: Family Medicine

## 2023-05-26 DIAGNOSIS — Z1231 Encounter for screening mammogram for malignant neoplasm of breast: Secondary | ICD-10-CM | POA: Diagnosis not present

## 2023-05-27 ENCOUNTER — Encounter: Payer: Self-pay | Admitting: Family Medicine

## 2023-05-31 ENCOUNTER — Ambulatory Visit (INDEPENDENT_AMBULATORY_CARE_PROVIDER_SITE_OTHER): Payer: BC Managed Care – PPO | Admitting: Family Medicine

## 2023-05-31 ENCOUNTER — Encounter: Payer: Self-pay | Admitting: Family Medicine

## 2023-05-31 VITALS — BP 120/70 | HR 57 | Temp 98.5°F | Resp 20 | Ht 63.0 in | Wt 135.0 lb

## 2023-05-31 DIAGNOSIS — M1990 Unspecified osteoarthritis, unspecified site: Secondary | ICD-10-CM

## 2023-05-31 DIAGNOSIS — Z0000 Encounter for general adult medical examination without abnormal findings: Secondary | ICD-10-CM | POA: Diagnosis not present

## 2023-05-31 DIAGNOSIS — M8580 Other specified disorders of bone density and structure, unspecified site: Secondary | ICD-10-CM

## 2023-05-31 DIAGNOSIS — R0683 Snoring: Secondary | ICD-10-CM | POA: Diagnosis not present

## 2023-05-31 DIAGNOSIS — Z23 Encounter for immunization: Secondary | ICD-10-CM | POA: Diagnosis not present

## 2023-05-31 DIAGNOSIS — E039 Hypothyroidism, unspecified: Secondary | ICD-10-CM

## 2023-05-31 MED ORDER — LEVOTHYROXINE SODIUM 125 MCG PO TABS: 125.0000 ug | ORAL_TABLET | Freq: Every day | ORAL | 3 refills | Status: DC

## 2023-05-31 NOTE — Patient Instructions (Addendum)
 It was a pleasure meeting you today. Thank you for allowing me to take part in your health care.  Our goals for today as we discussed include:  Received first Shinigles vaccine today Schedule RN appointment in 2 months for second vaccine  Refill sent for requested medication  Tetanus booster is due as well.  Dexa scan was completed in 2023  This is a list of the screening recommended for you and due dates:  Health Maintenance  Topic Date Due   DTaP/Tdap/Td vaccine (1 - Tdap) Never done   Zoster (Shingles) Vaccine (1 of 2) Never done   Flu Shot  08/20/2023   Mammogram  05/25/2024   Colon Cancer Screening  02/18/2033   COVID-19 Vaccine  Completed   HPV Vaccine  Aged Out   Meningitis B Vaccine  Aged Out   Hepatitis C Screening  Discontinued   HIV Screening  Discontinued       If you have any questions or concerns, please do not hesitate to call the office at [phone].  I look forward to our next visit and until then take care and stay safe.  Regards,   Valli Gaw, MD   [facility]

## 2023-05-31 NOTE — Progress Notes (Unsigned)
 SUBJECTIVE:   Chief Complaint  Patient presents with   Annual Exam   HPI Presents for annual physical  Discussed the use of AI scribe software for clinical note transcription with the patient, who gave verbal consent to proceed.  History of Present Illness Kathleen Sims is a 63 year old female who presents for an annual physical exam.  She completed her last set of labs in January, which were normal. She is up to date on her health maintenance, with her next mammogram due in May of the following year.  She had a DEXA scan approximately four years ago due to being on hormone replacement therapy after having her ovaries removed at age 68. She stopped hormone replacement in 2020 and is unsure of the location where the DEXA scan was performed. She takes calcium supplements, 250 mg capsules, and believes they contain vitamin D . She engages in weight-bearing exercises and maintains a diet that includes milk and yogurt.  She has a history of thyroid  issues and is currently on 125 mcg of Synthroid . Her thyroid  levels are stable, and she attributes previous issues to consuming soy products, which she has since stopped.  She uses hyoscyamine rarely, about three times in eight months, for severe spasms. She also takes Colace and uses Miralax frequently but not daily. She reports improvement in constipation and abdominal pain, attributing it to increased activity and adequate water intake.  Her husband suspects she has sleep apnea due to snoring, but she does not experience significant daytime fatigue or other symptoms typically associated with sleep apnea. She occasionally wakes up feeling panicked and unable to breathe but does not have chest pain or shortness of breath.  She has arthritis and recently received a shot in her foot for pain relief. She experiences congestion, particularly after outdoor activities, and notes that pollen has been particularly high this year.    Review of Systems -  Negative except listed above    PERTINENT PMH / PSH: As above  OBJECTIVE:  BP 120/70   Pulse (!) 57   Temp 98.5 F (36.9 C)   Resp 20   Ht 5\' 3"  (1.6 m)   Wt 135 lb (61.2 kg)   SpO2 98%   BMI 23.91 kg/m    Physical Exam Vitals reviewed.  Constitutional:      General: She is not in acute distress.    Appearance: She is not ill-appearing.  HENT:     Head: Normocephalic.     Right Ear: Tympanic membrane, ear canal and external ear normal.     Left Ear: Tympanic membrane, ear canal and external ear normal.     Nose: Nose normal.     Mouth/Throat:     Mouth: Mucous membranes are moist.  Eyes:     Extraocular Movements: Extraocular movements intact.     Conjunctiva/sclera: Conjunctivae normal.     Pupils: Pupils are equal, round, and reactive to light.  Neck:     Thyroid : No thyromegaly or thyroid  tenderness.     Vascular: No carotid bruit.  Cardiovascular:     Rate and Rhythm: Normal rate and regular rhythm.     Pulses: Normal pulses.     Heart sounds: Normal heart sounds.  Pulmonary:     Effort: Pulmonary effort is normal.     Breath sounds: Normal breath sounds.  Abdominal:     General: Bowel sounds are normal. There is no distension.     Palpations: Abdomen is soft.  Tenderness: There is no abdominal tenderness. There is no right CVA tenderness, left CVA tenderness, guarding or rebound.  Musculoskeletal:        General: Normal range of motion.     Cervical back: Normal range of motion.     Right lower leg: No edema.     Left lower leg: No edema.  Lymphadenopathy:     Cervical: No cervical adenopathy.  Skin:    Capillary Refill: Capillary refill takes less than 2 seconds.  Neurological:     General: No focal deficit present.     Mental Status: She is alert and oriented to person, place, and time. Mental status is at baseline.     Motor: No weakness.  Psychiatric:        Mood and Affect: Mood normal.        Behavior: Behavior normal.        Thought  Content: Thought content normal.        Judgment: Judgment normal.           05/31/2023    8:20 AM 05/31/2023    8:17 AM 04/02/2023    8:17 AM 03/17/2023    7:56 AM 11/30/2022    8:07 AM  Depression screen PHQ 2/9  Decreased Interest 0 0 0 0 0  Down, Depressed, Hopeless 0 0 0 0 0  PHQ - 2 Score 0 0 0 0 0  Altered sleeping 0 0 0 0 0  Tired, decreased energy 0 0 0 0 0  Change in appetite 0 0 0 0 0  Feeling bad or failure about yourself  0 0 0 0 0  Trouble concentrating 0 0 0 0 0  Moving slowly or fidgety/restless 0 0 0 0 0  Suicidal thoughts 0 0 0 0 0  PHQ-9 Score 0 0 0 0 0  Difficult doing work/chores Not difficult at all Not difficult at all Not difficult at all Not difficult at all Not difficult at all      05/31/2023    8:20 AM 05/31/2023    8:17 AM 04/02/2023    8:17 AM 03/17/2023    7:57 AM  GAD 7 : Generalized Anxiety Score  Nervous, Anxious, on Edge 0 0 0 0  Control/stop worrying 0 0 0 0  Worry too much - different things 0 0 0 0  Trouble relaxing 0 0 0 0  Restless 0 0 0 0  Easily annoyed or irritable 0 0 0 0  Afraid - awful might happen 0 0 0 0  Total GAD 7 Score 0 0 0 0  Anxiety Difficulty Not difficult at all Not difficult at all Not difficult at all Not difficult at all    ASSESSMENT/PLAN:  Annual physical exam Assessment & Plan: Routine wellness visit. Labs normal. Mammogram due May next year. Thyroid  well-controlled. Blood pressure and heart rate normal. No constipation issues. Discussed potential sleep apnea. Administered shingles vaccine. - Schedule mammogram for May next year. - Consider DEXA scan at age 11. - Monitor for symptoms of sleep apnea.   Hypothyroidism, unspecified type Assessment & Plan: Thyroid  function well-controlled on Synthroid  125 mcg. TSH level 1.9, normal range. - Refill Synthroid  125 mcg daily.   Orders: -     Levothyroxine  Sodium; Take 1 tablet (125 mcg total) by mouth daily before breakfast.  Dispense: 90 tablet; Refill:  3  Encounter for immunization -     Varicella-zoster vaccine IM  Arthritis Assessment & Plan: Arthritis in multiple joints, including foot. Recent  corticosteroid injection for pain. No new symptoms.   Osteopenia, unspecified location Assessment & Plan: Continue Vitamin D  supplement.  DEXA in 2023 Repeat Dexa in 1 year   Snoring Assessment & Plan: Snoring and occasional waking suggest possible sleep apnea. No significant daytime fatigue. Prefers symptom monitoring. - Prefers to hold off on sleep studies        PDMP reviewed  Return for RN clinic.  Valli Gaw, MD

## 2023-06-03 ENCOUNTER — Encounter: Payer: Self-pay | Admitting: Family Medicine

## 2023-06-03 DIAGNOSIS — Z0000 Encounter for general adult medical examination without abnormal findings: Secondary | ICD-10-CM | POA: Insufficient documentation

## 2023-06-03 DIAGNOSIS — Z23 Encounter for immunization: Secondary | ICD-10-CM | POA: Insufficient documentation

## 2023-06-03 DIAGNOSIS — R0683 Snoring: Secondary | ICD-10-CM | POA: Insufficient documentation

## 2023-06-03 DIAGNOSIS — M8580 Other specified disorders of bone density and structure, unspecified site: Secondary | ICD-10-CM | POA: Insufficient documentation

## 2023-06-03 HISTORY — DX: Encounter for immunization: Z23

## 2023-06-03 NOTE — Assessment & Plan Note (Signed)
 Snoring and occasional waking suggest possible sleep apnea. No significant daytime fatigue. Prefers symptom monitoring. - Prefers to hold off on sleep studies

## 2023-06-03 NOTE — Assessment & Plan Note (Signed)
 Thyroid  function well-controlled on Synthroid  125 mcg. TSH level 1.9, normal range. - Refill Synthroid  125 mcg daily.

## 2023-06-03 NOTE — Assessment & Plan Note (Signed)
 Arthritis in multiple joints, including foot. Recent corticosteroid injection for pain. No new symptoms.

## 2023-06-03 NOTE — Assessment & Plan Note (Signed)
 Continue Vitamin D  supplement.  DEXA in 2023 Repeat Dexa in 1 year

## 2023-06-03 NOTE — Assessment & Plan Note (Signed)
 Routine wellness visit. Labs normal. Mammogram due May next year. Thyroid  well-controlled. Blood pressure and heart rate normal. No constipation issues. Discussed potential sleep apnea. Administered shingles vaccine. - Schedule mammogram for May next year. - Consider DEXA scan at age 63. - Monitor for symptoms of sleep apnea.

## 2023-06-10 ENCOUNTER — Encounter: Payer: Self-pay | Admitting: Emergency Medicine

## 2023-06-10 ENCOUNTER — Inpatient Hospital Stay (HOSPITAL_BASED_OUTPATIENT_CLINIC_OR_DEPARTMENT_OTHER)
Admit: 2023-06-10 | Discharge: 2023-06-10 | Disposition: A | Discharge status: Home / Self Care | Attending: Internal Medicine | Admitting: Internal Medicine

## 2023-06-10 ENCOUNTER — Emergency Department

## 2023-06-10 ENCOUNTER — Observation Stay
Admission: EM | Admit: 2023-06-10 | Discharge: 2023-06-12 | Disposition: A | Discharge status: Home / Self Care | Attending: Obstetrics and Gynecology | Admitting: Obstetrics and Gynecology

## 2023-06-10 ENCOUNTER — Other Ambulatory Visit: Payer: Self-pay

## 2023-06-10 ENCOUNTER — Inpatient Hospital Stay

## 2023-06-10 DIAGNOSIS — R200 Anesthesia of skin: Secondary | ICD-10-CM | POA: Diagnosis not present

## 2023-06-10 DIAGNOSIS — Z7982 Long term (current) use of aspirin: Secondary | ICD-10-CM | POA: Diagnosis not present

## 2023-06-10 DIAGNOSIS — I639 Cerebral infarction, unspecified: Secondary | ICD-10-CM | POA: Diagnosis not present

## 2023-06-10 DIAGNOSIS — K589 Irritable bowel syndrome without diarrhea: Secondary | ICD-10-CM | POA: Insufficient documentation

## 2023-06-10 DIAGNOSIS — F419 Anxiety disorder, unspecified: Secondary | ICD-10-CM | POA: Diagnosis not present

## 2023-06-10 DIAGNOSIS — Z79899 Other long term (current) drug therapy: Secondary | ICD-10-CM | POA: Insufficient documentation

## 2023-06-10 DIAGNOSIS — R202 Paresthesia of skin: Secondary | ICD-10-CM | POA: Diagnosis not present

## 2023-06-10 DIAGNOSIS — D497 Neoplasm of unspecified behavior of endocrine glands and other parts of nervous system: Secondary | ICD-10-CM | POA: Insufficient documentation

## 2023-06-10 DIAGNOSIS — G459 Transient cerebral ischemic attack, unspecified: Secondary | ICD-10-CM

## 2023-06-10 DIAGNOSIS — Z8673 Personal history of transient ischemic attack (TIA), and cerebral infarction without residual deficits: Secondary | ICD-10-CM | POA: Diagnosis present

## 2023-06-10 DIAGNOSIS — D352 Benign neoplasm of pituitary gland: Secondary | ICD-10-CM | POA: Diagnosis not present

## 2023-06-10 DIAGNOSIS — E039 Hypothyroidism, unspecified: Secondary | ICD-10-CM | POA: Diagnosis not present

## 2023-06-10 DIAGNOSIS — I6782 Cerebral ischemia: Secondary | ICD-10-CM | POA: Diagnosis not present

## 2023-06-10 HISTORY — DX: Cerebral infarction, unspecified: I63.9

## 2023-06-10 LAB — DIFFERENTIAL
Abs Immature Granulocytes: 0.02 10*3/uL (ref 0.00–0.07)
Basophils Absolute: 0.1 10*3/uL (ref 0.0–0.1)
Basophils Relative: 1 %
Eosinophils Absolute: 0.2 10*3/uL (ref 0.0–0.5)
Eosinophils Relative: 4 %
Immature Granulocytes: 0 %
Lymphocytes Relative: 33 %
Lymphs Abs: 1.8 10*3/uL (ref 0.7–4.0)
Monocytes Absolute: 0.5 10*3/uL (ref 0.1–1.0)
Monocytes Relative: 9 %
Neutro Abs: 2.9 10*3/uL (ref 1.7–7.7)
Neutrophils Relative %: 53 %

## 2023-06-10 LAB — CBC
HCT: 39.2 % (ref 36.0–46.0)
Hemoglobin: 13.3 g/dL (ref 12.0–15.0)
MCH: 31.4 pg (ref 26.0–34.0)
MCHC: 33.9 g/dL (ref 30.0–36.0)
MCV: 92.5 fL (ref 80.0–100.0)
Platelets: 332 10*3/uL (ref 150–400)
RBC: 4.24 MIL/uL (ref 3.87–5.11)
RDW: 12.2 % (ref 11.5–15.5)
WBC: 5.4 10*3/uL (ref 4.0–10.5)
nRBC: 0 % (ref 0.0–0.2)

## 2023-06-10 LAB — COMPREHENSIVE METABOLIC PANEL WITH GFR
ALT: 15 U/L (ref 0–44)
AST: 21 U/L (ref 15–41)
Albumin: 4.4 g/dL (ref 3.5–5.0)
Alkaline Phosphatase: 52 U/L (ref 38–126)
Anion gap: 11 (ref 5–15)
BUN: 24 mg/dL — ABNORMAL HIGH (ref 8–23)
CO2: 26 mmol/L (ref 22–32)
Calcium: 9.3 mg/dL (ref 8.9–10.3)
Chloride: 101 mmol/L (ref 98–111)
Creatinine, Ser: 1.14 mg/dL — ABNORMAL HIGH (ref 0.44–1.00)
GFR, Estimated: 54 mL/min — ABNORMAL LOW (ref >60)
Glucose, Bld: 113 mg/dL — ABNORMAL HIGH (ref 70–99)
Potassium: 4 mmol/L (ref 3.5–5.1)
Sodium: 138 mmol/L (ref 135–145)
Total Bilirubin: 0.8 mg/dL (ref 0.0–1.2)
Total Protein: 7.4 g/dL (ref 6.5–8.1)

## 2023-06-10 LAB — HEMOGLOBIN A1C
Hgb A1c MFr Bld: 5.1 % (ref 4.8–5.6)
Mean Plasma Glucose: 99.67 mg/dL

## 2023-06-10 LAB — ETHANOL: Alcohol, Ethyl (B): <15 mg/dL (ref <15)

## 2023-06-10 LAB — PROTIME-INR
INR: 1.1 (ref 0.8–1.2)
Prothrombin Time: 14 s (ref 11.4–15.2)

## 2023-06-10 LAB — CBG MONITORING, ED: Glucose-Capillary: 109 mg/dL — ABNORMAL HIGH (ref 70–99)

## 2023-06-10 LAB — APTT: aPTT: 27 s (ref 24–36)

## 2023-06-10 MED ORDER — LEVOTHYROXINE SODIUM 25 MCG PO TABS
125.0000 ug | ORAL_TABLET | Freq: Every day | ORAL | Status: DC
  Administered 2023-06-11 – 2023-06-12 (×2): 125 ug via ORAL
  Filled 2023-06-10 (×2): qty 1

## 2023-06-10 MED ORDER — ACETAMINOPHEN 160 MG/5ML PO SOLN: 650.0000 mg | ORAL | Status: DC | PRN

## 2023-06-10 MED ORDER — CLOPIDOGREL BISULFATE 75 MG PO TABS
75.0000 mg | ORAL_TABLET | Freq: Once | ORAL | Status: AC
  Administered 2023-06-10: 75 mg via ORAL
  Filled 2023-06-10: qty 1

## 2023-06-10 MED ORDER — ASPIRIN 81 MG PO CHEW
81.0000 mg | CHEWABLE_TABLET | Freq: Every day | ORAL | Status: DC
  Administered 2023-06-11 – 2023-06-12 (×2): 81 mg via ORAL
  Filled 2023-06-10 (×2): qty 1

## 2023-06-10 MED ORDER — ASPIRIN 81 MG PO CHEW
81.0000 mg | CHEWABLE_TABLET | Freq: Once | ORAL | Status: AC
  Administered 2023-06-10: 81 mg via ORAL
  Filled 2023-06-10: qty 1

## 2023-06-10 MED ORDER — STROKE: EARLY STAGES OF RECOVERY BOOK: Freq: Once | Status: DC

## 2023-06-10 MED ORDER — CLOPIDOGREL BISULFATE 75 MG PO TABS
75.0000 mg | ORAL_TABLET | Freq: Every day | ORAL | Status: DC
  Administered 2023-06-11 – 2023-06-12 (×2): 75 mg via ORAL
  Filled 2023-06-10 (×2): qty 1

## 2023-06-10 MED ORDER — ACETAMINOPHEN 325 MG PO TABS
650.0000 mg | ORAL_TABLET | ORAL | Status: DC | PRN
  Administered 2023-06-11 – 2023-06-12 (×4): 650 mg via ORAL
  Filled 2023-06-10 (×3): qty 2

## 2023-06-10 MED ORDER — CALCIUM CARBONATE ANTACID 500 MG PO CHEW
500.0000 mg | CHEWABLE_TABLET | Freq: Every day | ORAL | Status: DC
  Administered 2023-06-11 – 2023-06-12 (×2): 200 mg via ORAL
  Filled 2023-06-10 (×2): qty 1

## 2023-06-10 MED ORDER — HEPARIN SODIUM (PORCINE) 5000 UNIT/ML IJ SOLN
5000.0000 [IU] | Freq: Three times a day (TID) | INTRAMUSCULAR | Status: DC
  Administered 2023-06-10 – 2023-06-12 (×6): 5000 [IU] via SUBCUTANEOUS
  Filled 2023-06-10 (×5): qty 1

## 2023-06-10 MED ORDER — ACETAMINOPHEN 650 MG RE SUPP: 650.0000 mg | RECTAL | Status: DC | PRN

## 2023-06-10 MED ORDER — SODIUM CHLORIDE 0.9% FLUSH: 3.0000 mL | Freq: Once | INTRAVENOUS | Status: DC

## 2023-06-10 MED ORDER — LABETALOL HCL 5 MG/ML IV SOLN: 10.0000 mg | INTRAVENOUS | Status: DC | PRN

## 2023-06-10 MED ORDER — SODIUM CHLORIDE 0.9 % IV SOLN: INTRAVENOUS | Status: AC

## 2023-06-10 MED ORDER — DOCUSATE SODIUM 100 MG PO CAPS
100.0000 mg | ORAL_CAPSULE | Freq: Every day | ORAL | Status: DC
  Administered 2023-06-10 – 2023-06-12 (×3): 100 mg via ORAL
  Filled 2023-06-10 (×3): qty 1

## 2023-06-10 MED ORDER — SENNOSIDES-DOCUSATE SODIUM 8.6-50 MG PO TABS: 1.0000 | ORAL_TABLET | Freq: Every evening | ORAL | Status: DC | PRN

## 2023-06-10 NOTE — Plan of Care (Signed)
  Problem: Education: Goal: Knowledge of secondary prevention will improve (MUST DOCUMENT ALL) Outcome: Progressing   Problem: Coping: Goal: Will verbalize positive feelings about self Outcome: Progressing   Problem: Self-Care: Goal: Ability to participate in self-care as condition permits will improve Outcome: Progressing   Problem: Nutrition: Goal: Risk of aspiration will decrease Outcome: Progressing

## 2023-06-10 NOTE — ED Notes (Signed)
 CT called - pt will go to CT 3

## 2023-06-10 NOTE — Progress Notes (Signed)
   06/10/23 0900  Spiritual Encounters  Type of Visit Initial  Care provided to: Pt and family  Referral source Code page  Reason for visit Code  OnCall Visit Yes  Interventions  Spiritual Care Interventions Made Established relationship of care and support;Compassionate presence  Intervention Outcomes  Outcomes Connection to spiritual care   Chaplain provided compassionate presence. Patient was in great spirits, but complained about some numbness in her body. Chaplain encourage pt and let them know another chaplain will probably.

## 2023-06-10 NOTE — Progress Notes (Signed)
  Echocardiogram 2D Echocardiogram has been performed.  Kathleen Sims 06/10/2023, 5:22 PM

## 2023-06-10 NOTE — ED Notes (Signed)
 Charge RN called to make aware of CODE STROKE

## 2023-06-10 NOTE — Evaluation (Signed)
 Physical Therapy Evaluation Patient Details Name: Kathleen Sims MRN: [id number] DOB: February 13, 1960 Today's Date: 06/10/2023  History of Present Illness  63 y.o. female with hx of hypothyroidism and hyperlipidemia who presents with acute onset L sided numbness  Clinical Impression  Pt did very well with PT exam.  Still having residual L sided numbness but otherwise fully at baseline function with no concerns or need for further PT f/u.  Pt displayed safe and appropriate ambulation at community speed and with various (DGI) challenges. Pt did well, good confidence and safety, no further PT needs.      If plan is discharge home, recommend the following:     Can travel by private vehicle        Equipment Recommendations None recommended by PT  Recommendations for Other Services       Functional Status Assessment Patient has not had a recent decline in their functional status     Precautions / Restrictions Precautions Precautions: None Restrictions Weight Bearing Restrictions Per Provider Order: No      Mobility  Bed Mobility Overal bed mobility: Independent             General bed mobility comments: easily and confidently gets up to sitting w/o assist    Transfers Overall transfer level: Independent Equipment used: None               General transfer comment: easily and confidently able to rise to standing EOB w/o assist    Ambulation/Gait Ambulation/Gait assistance: Independent Gait Distance (Feet): 200 Feet Assistive device: None         General Gait Details: Pt was able to easily and confidently ambulate with head turns, nods, speed/directional changes, sudden stops, etc. All w/o issue, no LOBs or overt unsteadiness  Stairs            Wheelchair Mobility     Tilt Bed    Modified Rankin (Stroke Patients Only)       Balance Overall balance assessment: Independent                               Standardized Balance  Assessment Standardized Balance Assessment :  (SLS with EC X 10 sec, NBOS EC perturbations w/o issue, no balance safety concerns)           Pertinent Vitals/Pain Pain Assessment Pain Assessment: No/denies pain    Home Living Family/patient expects to be discharged to:: Private residence Living Arrangements: Spouse/significant other Available Help at Discharge: Available 24 hours/day Type of Home: House Home Access: Stairs to enter   Secretary/administrator of Steps: 3+1   Home Layout: One level        Prior Function Prior Level of Function : Independent/Modified Independent             Mobility Comments: Pt drives, works, able to be active ADLs Comments: indepenent, no issues     Extremity/Trunk Assessment   Upper Extremity Assessment Upper Extremity Assessment: Overall WFL for tasks assessed (functionally intact, reports residual numbness t/o L UE)    Lower Extremity Assessment Lower Extremity Assessment: Overall WFL for tasks assessed (functionally intact, reports residual numbness t/o L UE)       Communication   Communication Communication: No apparent difficulties    Cognition Arousal: Alert Behavior During Therapy: WFL for tasks assessed/performed   PT - Cognitive impairments: No apparent impairments  Following commands: Intact       Cueing Cueing Techniques: Verbal cues     General Comments General comments (skin integrity, edema, etc.): Pt still having residual L sided numbness (face and appendages) but functionally WNF    Exercises     Assessment/Plan    PT Assessment Patient does not need any further PT services  PT Problem List         PT Treatment Interventions      PT Goals (Current goals can be found in the Care Plan section)  Acute Rehab PT Goals Patient Stated Goal: go home PT Goal Formulation: All assessment and education complete, DC therapy    Frequency       Co-evaluation                AM-PAC PT "6 Clicks" Mobility  Outcome Measure Help needed turning from your back to your side while in a flat bed without using bedrails?: None Help needed moving from lying on your back to sitting on the side of a flat bed without using bedrails?: None Help needed moving to and from a bed to a chair (including a wheelchair)?: None Help needed standing up from a chair using your arms (e.g., wheelchair or bedside chair)?: None Help needed to walk in hospital room?: None Help needed climbing 3-5 steps with a railing? : None 6 Click Score: 24    End of Session   Activity Tolerance: Patient tolerated treatment well Patient left: in bed;with family/visitor present Nurse Communication: Mobility status (no further PT needs) PT Visit Diagnosis: Other symptoms and signs involving the nervous system (R29.898)    Time: 4098-1191 PT Time Calculation (min) (ACUTE ONLY): 10 min   Charges:   PT Evaluation $PT Eval Low Complexity: 1 Low   PT General Charges $$ ACUTE PT VISIT: 1 Visit         Darice Edelman, DPT 06/10/2023, 4:04 PM

## 2023-06-10 NOTE — Consult Note (Signed)
 NEUROLOGY CONSULT NOTE   Date of service: Jun 10, 2023 Patient Name: Kathleen Sims MRN:  [id number] DOB:  11-Dec-1960 Chief Complaint: stroke code Requesting Provider: Collis Deaner, MD  History of Present Illness   Kathleen Sims is a 63 y.o. female with hx of hypothyroidism and hyperlipidemia who presents with acute onset L sided numbness. She woke up around 0400 and was normal. LKW 0700 after which she noticed new tingling in L face. At time of stroke code examination she reported numbness in L face, arm, and leg. No other deficits. NIHSS = 1. Head CT showed no acute process on personal review. TNK was not administered 2/2 too mild to treat.  LKW: 0700 Modified rankin score: 0-Completely asymptomatic and back to baseline post- stroke IV Thrombolysis: no, too mild to treat  NIHSS components Score: Comment  1a Level of Conscious 0[]  1[]  2[]  3[]      1b LOC Questions 0[]  1[]  2[]       1c LOC Commands 0[]  1[]  2[]       2 Best Gaze 0[]  1[]  2[]       3 Visual 0[]  1[]  2[]  3[]      4 Facial Palsy 0[]  1[]  2[]  3[]      5a Motor Arm - left 0[]  1[]  2[]  3[]  4[]  UN[]    5b Motor Arm - Right 0[]  1[]  2[]  3[]  4[]  UN[]    6a Motor Leg - Left 0[]  1[]  2[]  3[]  4[]  UN[]    6b Motor Leg - Right 0[]  1[]  2[]  3[]  4[]  UN[]    7 Limb Ataxia 0[]  1[]  2[]  3[]  UN[]     8 Sensory 0[]  1[x]  2[]  UN[]      9 Best Language 0[]  1[]  2[]  3[]      10 Dysarthria 0[]  1[]  2[]  UN[]      11 Extinct. and Inattention 0[]  1[]  2[]       TOTAL:  1   Any unchecked boxes represent a score of 0   ROS   Comprehensive ROS performed and pertinent positives documented in HPI   Past History   Past Medical History:  Diagnosis Date   Anxiety    Arthritis    Diverticulitis    History of diverticulitis 05/31/2019   History of hydronephrosis 05/31/2019   History of nephrolithiasis 05/31/2019   Hypothyroid 05/30/2019   IBS (irritable bowel syndrome) 05/31/2019   Kidney stones    Neck arthritis 05/31/2019   Nephrolithiasis 05/31/2019   Thyroid   disease     Past Surgical History:  Procedure Laterality Date   ABDOMINAL HYSTERECTOMY     ANAL FISSURE REPAIR  1999   with sphincterotomy   BREAST BIOPSY Left 06/2012   neg   HYSTERECTOMY ABDOMINAL WITH SALPINGECTOMY     URETEROSCOPY WITH HOLMIUM LASER LITHOTRIPSY Left 09/28/2019   Procedure: URETEROSCOPY WITH HOLMIUM LASER LITHOTRIPSY;  Surgeon: Rea Cambridge, MD;  Location: [facility];  Service: Urology;  Laterality: Left;    Family History: Family History  Problem Relation Age of Onset   Breast cancer Maternal Aunt        great Mat. 49's   Arthritis Mother    Diabetes Mother    Hearing loss Mother    Hyperlipidemia Mother    Hypertension Mother    Heart disease Father    Stroke Sister    Parkinson's disease Sister    Hypertension Sister    Early death Sister    Diabetes Sister    Heart disease Brother    Cystic fibrosis Brother    Arthritis Sister  Asthma Sister    Depression Sister    Hyperlipidemia Sister    Hypertension Sister    Hypertension Sister    Hyperlipidemia Sister    Arthritis Sister    Arthritis Sister    Depression Sister    Hypertension Sister    Multiple sclerosis Sister    Varicose Veins Sister    Diabetes Sister    Hypertension Sister    Stroke Sister    Heart disease Brother     Social History  reports that she has never smoked. She has never used smokeless tobacco. She reports current alcohol use of about 1.0 standard drink of alcohol per week. She reports that she does not use drugs.  Allergies  Allergen Reactions   Amoxicillin     Other reaction(s): Unknown   Lansoprazole Other (See Comments)    Other reaction(s Intolerance on daily bases   Penicillins Hives    Childhood     Medications   Current Facility-Administered Medications:    sodium chloride  flush (NS) 0.9 % injection 3 mL, 3 mL, Intravenous, Once, Collis Deaner, MD  Current Outpatient Medications:    Calcium 250 MG CAPS, Take 250 mg by mouth daily., Disp: ,  Rfl:    docusate sodium (COLACE) 100 MG capsule, Take 100 mg by mouth daily., Disp: , Rfl:    Hyoscyamine Sulfate SL 0.125 MG SUBL, Place under the tongue., Disp: , Rfl:    levothyroxine  (SYNTHROID ) 125 MCG tablet, Take 1 tablet (125 mcg total) by mouth daily before breakfast., Disp: 90 tablet, Rfl: 3  Vitals   Vitals:   22-Jun-2023 0813 06-22-23 0833 June 22, 2023 0900 22-Jun-2023 1000  BP: (!) 165/87 (!) 157/76 (!) 146/82 137/84  Pulse: 71 63 63 (!) 59  Resp: 18 14 (!) 21 11  Temp: 98.8 F (37.1 C) 98.5 F (36.9 C)    TempSrc: Oral Oral    SpO2: 96% 100% 97% 98%  Weight:      Height:        Body mass index is 23.03 kg/m.  Physical Exam   Gen: patient lying in bed, NAD CV: extremities appear well-perfused Resp: normal WOB  Neurologic Examination   MS: alert, oriented x4, follows commands Speech: no dysarthria, no aphasia CN: PERRL, VFF, EOMI, L facial numbness, face symmetric, hearing intact to voice Motor: 5/5 strength throughout Sensory: sensory impairment to LUE and LLE. No hemineglect. Reflexes: 2+ symm with toes down bilat Coordination: FNF intact bilat Gait: deferred  Labs/Imaging/Neurodiagnostic studies   CBC:  Recent Labs  Lab 06/22/2023 0813  WBC 5.4  NEUTROABS 2.9  HGB 13.3  HCT 39.2  MCV 92.5  PLT 332   Basic Metabolic Panel:  Lab Results  Component Value Date   NA 138 22-Jun-2023   K 4.0 06/22/2023   CO2 26 06-22-2023   GLUCOSE 113 (H) 06/22/2023   BUN 24 (H) 06-22-2023   CREATININE 1.14 (H) 06/22/2023   CALCIUM 9.3 06/22/2023   GFRNONAA 54 (L) 06/22/23   GFRAA >60 07/14/2017   Lipid Panel:  Lab Results  Component Value Date   LDLCALC 159 08/19/2021   HgbA1c: No results found for: "HGBA1C" Urine Drug Screen: No results found for: "LABOPIA", "COCAINSCRNUR", "LABBENZ", "AMPHETMU", "THCU", "LABBARB"  Alcohol Level     Component Value Date/Time   [facility] <15 Jun 22, 2023 0813   INR  Lab Results  Component Value Date   INR 1.1 06/22/23   APTT   Lab Results  Component Value Date   APTT 27 06-22-2023  AED levels: No results found for: "PHENYTOIN", "ZONISAMIDE", "LAMOTRIGINE", "LEVETIRACETA"  CT Head without contrast(Personally reviewed): No acute process  ASSESSMENT   Kathleen Sims is a 63 y.o. female who presents as stroke code for acute onset of L sided numbness concerning for TIA vs small acute infarct.  RECOMMENDATIONS   - Recommend close monitoring in ED with vital signs and NIHSS both q 30 min until outside of the TNK window at 1130.  If neurologic exam is worsens a stroke code should be immediately reactivated.  If exam is stable or improved at 1130 pt should at that point be admitted to the hospitalist service for stroke/TIA work-up. - Permissive HTN x48 hrs from sx onset or until stroke ruled out by MRI goal BP <220/110. PRN labetalol or hydralazine if BP above these parameters. Avoid oral antihypertensives. - MRI brain wo contrast - CTA or MRA H&N - TTE w/ bubble - Check A1c and LDL + add statin per guidelines - ASA 81mg  daily + plavix 75mg  daily x21 days f/b ASA 81mg  daily monotherapy after that - q4 hr neuro checks - STAT head CT for any change in neuro exam - Tele - PT/OT/SLP - Stroke education - Amb referral to neurology upon discharge  - Neurology will continue to follow   ______________________________________________________________________    Signed, Eleni Griffin, MD [facility]

## 2023-06-10 NOTE — ED Notes (Signed)
 Primary RN called to make aware of pt going to CT 3

## 2023-06-10 NOTE — Progress Notes (Signed)
 OT Cancellation Note  Patient Details Name: Kathleen Sims MRN: [id number] DOB: 03/24/1960   Cancelled Treatment:    Reason Eval/Treat Not Completed: OT screened, no needs identified, will sign off. Consult received. PT evaluated and discharged in house. Spoke with PT and no functional deficits appreciated. No acute skilled OT needs. Please re-consult if additional acute OT needs.   Detrell Umscheid R., MPH, MS, OTR/L ascom [phone] 06/10/23, 3:40 PM

## 2023-06-10 NOTE — H&P (Signed)
 History and Physical    Kathleen Sims WGN:[id number] DOB: 11-11-1960 DOA: 06/10/2023  PCP: Valli Gaw, MD  Patient coming from: home  I have personally briefly reviewed patient's old medical records in [facility]  Chief Complaint:  left sided numbness that started on 0700  HPI: Kathleen Sims is a 63 y.o. female with medical history significant of  Anxiety, IBS, hypothyroidism who presents to ED with complaints of acute onset of left sided numbness for which code stroke was called. Patient notes no associated vision changes, HA, weakness, difficulty swallowing. She notes no prior episodes like this in the past.  ED Course:  CODE stroke called: Patient initial NIH score 1 for mild mod-sensory loss CTH: negative  Patient seen by neurology who recommended DAPT and admission for stroke work up.  Vitals: afeb, bp 146/82, hr 63, rr 21 sat 98%  Wbc : 5.4, hgb 13.3, plt332 NA 138, K 4, CL 101, glu 113, cr 1.14 EKG: snr Tx plavix/asa MRI: pending Review of Systems: As per HPI otherwise 10 point review of systems negative.   Past Medical History:  Diagnosis Date   Anxiety    Arthritis    Diverticulitis    History of diverticulitis 05/31/2019   History of hydronephrosis 05/31/2019   History of nephrolithiasis 05/31/2019   Hypothyroid 05/30/2019   IBS (irritable bowel syndrome) 05/31/2019   Kidney stones    Neck arthritis 05/31/2019   Nephrolithiasis 05/31/2019   Thyroid  disease     Past Surgical History:  Procedure Laterality Date   ABDOMINAL HYSTERECTOMY     ANAL FISSURE REPAIR  1999   with sphincterotomy   BREAST BIOPSY Left 06/2012   neg   HYSTERECTOMY ABDOMINAL WITH SALPINGECTOMY     URETEROSCOPY WITH HOLMIUM LASER LITHOTRIPSY Left 09/28/2019   Procedure: URETEROSCOPY WITH HOLMIUM LASER LITHOTRIPSY;  Surgeon: Rea Cambridge, MD;  Location: [facility] ORS;  Service: Urology;  Laterality: Left;     reports that she has never smoked. She has never used smokeless tobacco. She  reports current alcohol use of about 1.0 standard drink of alcohol per week. She reports that she does not use drugs.  Allergies  Allergen Reactions   Amoxicillin     Other reaction(s): Unknown   Lansoprazole Other (See Comments)    Other reaction(s Intolerance on daily bases   Penicillins Hives    Childhood    Other Rash    Family History  Problem Relation Age of Onset   Breast cancer Maternal Aunt        great Mat. 52's   Arthritis Mother    Diabetes Mother    Hearing loss Mother    Hyperlipidemia Mother    Hypertension Mother    Heart disease Father    Stroke Sister    Parkinson's disease Sister    Hypertension Sister    Early death Sister    Diabetes Sister    Heart disease Brother    Cystic fibrosis Brother    Arthritis Sister    Asthma Sister    Depression Sister    Hyperlipidemia Sister    Hypertension Sister    Hypertension Sister    Hyperlipidemia Sister    Arthritis Sister    Arthritis Sister    Depression Sister    Hypertension Sister    Multiple sclerosis Sister    Varicose Veins Sister    Diabetes Sister    Hypertension Sister    Stroke Sister    Heart disease Brother  Prior to Admission medications   Medication Sig Start Date End Date Taking? Authorizing Provider  Calcium 250 MG CAPS Take 250 mg by mouth daily.   Yes [provider]  docusate sodium (COLACE) 100 MG capsule Take 100 mg by mouth daily.   Yes [provider]  levothyroxine  (SYNTHROID ) 125 MCG tablet Take 1 tablet (125 mcg total) by mouth daily before breakfast. 05/31/23  Yes Valli Gaw, MD  Hyoscyamine Sulfate SL 0.125 MG SUBL Place under the tongue. Patient not taking: Reported on 06/10/2023 04/01/22   [provider]    Physical Exam: Vitals:   06/10/23 1000 06/10/23 1100 06/10/23 1130 06/10/23 1200  BP: 137/84 123/82 (!) 134/90 132/80  Pulse: (!) 59 60 60 64  Resp: 11 16 19 17   Temp:      TempSrc:      SpO2: 98% 96% 97% 96%  Weight:       Height:        Constitutional: NAD, calm, comfortable Vitals:   06/10/23 1000 06/10/23 1100 06/10/23 1130 06/10/23 1200  BP: 137/84 123/82 (!) 134/90 132/80  Pulse: (!) 59 60 60 64  Resp: 11 16 19 17   Temp:      TempSrc:      SpO2: 98% 96% 97% 96%  Weight:      Height:       Eyes: PERRL, lids and conjunctivae normal ENMT: Mucous membranes are moist. Posterior pharynx clear of any exudate or lesions.Normal dentition.  Neck: normal, supple, no masses, no thyromegaly Respiratory: clear to auscultation bilaterally, no wheezing, no crackles. Normal respiratory effort. No accessory muscle use.  Cardiovascular: Regular rate and rhythm, no murmurs / rubs / gallops. No extremity edema. 2+ pedal pulses.  Abdomen: no tenderness, no masses palpated. No hepatosplenomegaly. Bowel sounds positive.  Musculoskeletal: no clubbing / cyanosis. No joint deformity upper and lower extremities. Good ROM, no contractures. Normal muscle tone.  Skin: no rashes, lesions, ulcers. No induration Neurologic: CN 2-12 grossly intact. Sensation intact, DTR normal. Strength 5/5 in all 4.  Mild left lower face and forehead numbness Psychiatric: Normal judgment and insight. Alert and oriented x 3. Normal mood.    Labs on Admission: I have personally reviewed following labs and imaging studies  CBC: Recent Labs  Lab 06/10/23 0813  WBC 5.4  NEUTROABS 2.9  HGB 13.3  HCT 39.2  MCV 92.5  PLT 332   Basic Metabolic Panel: Recent Labs  Lab 06/10/23 0813  NA 138  K 4.0  CL 101  CO2 26  GLUCOSE 113*  BUN 24*  CREATININE 1.14*  CALCIUM 9.3   GFR: Estimated Creatinine Clearance: 41.2 mL/min (A) (by C-G formula based on SCr of 1.14 mg/dL (H)). Liver Function Tests: Recent Labs  Lab 06/10/23 0813  AST 21  ALT 15  ALKPHOS 52  BILITOT 0.8  PROT 7.4  ALBUMIN 4.4   No results for input(s): "LIPASE", "AMYLASE" in the last 168 hours. No results for input(s): "AMMONIA" in the last 168 hours. Coagulation  Profile: Recent Labs  Lab 06/10/23 0813  INR 1.1   Cardiac Enzymes: No results for input(s): "CKTOTAL", "CKMB", "CKMBINDEX", "TROPONINI" in the last 168 hours. BNP (last 3 results) No results for input(s): "PROBNP" in the last 8760 hours. HbA1C: No results for input(s): "HGBA1C" in the last 72 hours. CBG: Recent Labs  Lab 06/10/23 0812  GLUCAP 109*   Lipid Profile: No results for input(s): "CHOL", "HDL", "LDLCALC", "TRIG", "CHOLHDL", "LDLDIRECT" in the last 72 hours. Thyroid  Function  Tests: No results for input(s): "TSH", "T4TOTAL", "FREET4", "T3FREE", "THYROIDAB" in the last 72 hours. Anemia Panel: No results for input(s): "VITAMINB12", "FOLATE", "FERRITIN", "TIBC", "IRON", "RETICCTPCT" in the last 72 hours. Urine analysis:    Component Value Date/Time   COLORURINE YELLOW (A) 07/14/2017 0602   APPEARANCEUR HAZY (A) 07/14/2017 0602   APPEARANCEUR Hazy 12/08/2012 0721   LABSPEC 1.017 07/14/2017 0602   LABSPEC 1.028 12/08/2012 0721   PHURINE 6.0 07/14/2017 0602   GLUCOSEU NEGATIVE 07/14/2017 0602   GLUCOSEU Negative 12/08/2012 0721   HGBUR MODERATE (A) 07/14/2017 0602   BILIRUBINUR NEGATIVE 07/14/2017 0602   BILIRUBINUR Negative 12/08/2012 0721   KETONESUR NEGATIVE 07/14/2017 0602   PROTEINUR NEGATIVE 07/14/2017 0602   NITRITE NEGATIVE 07/14/2017 0602   LEUKOCYTESUR NEGATIVE 07/14/2017 0602   LEUKOCYTESUR Negative 12/08/2012 0721    Radiological Exams on Admission: CT HEAD CODE STROKE WO CONTRAST Result Date: 06/10/2023 CLINICAL DATA:  Code stroke. Hand numbness beginning today. Left forehead numbness which extended into the left upper and lower extremity. EXAM: CT HEAD WITHOUT CONTRAST TECHNIQUE: Contiguous axial images were obtained from the base of the skull through the vertex without intravenous contrast. RADIATION DOSE REDUCTION: This exam was performed according to the departmental dose-optimization program which includes automated exposure control, adjustment of  the mA and/or kV according to patient size and/or use of iterative reconstruction technique. COMPARISON:  None Available. FINDINGS: Brain: No acute infarct, hemorrhage, or mass lesion is present. No significant white matter lesions are present. Deep brain nuclei are within normal limits. The ventricles are of normal size. No significant extraaxial fluid collection is present. Midline structures are within normal limits. The brainstem and cerebellum are within normal limits. Vascular: No hyperdense vessel or unexpected calcification. Skull: Calvarium is intact. No focal lytic or blastic lesions are present. No significant extracranial soft tissue lesion is present. Sinuses/Orbits: The paranasal sinuses and mastoid air cells are clear. The globes and orbits are within normal limits. ASPECTS [facility] Stroke Program Early CT Score) - Ganglionic level infarction (caudate, lentiform nuclei, internal capsule, insula, M1-M3 cortex): 7/7 - Supraganglionic infarction (M4-M6 cortex): 3/3 Total score (0-10 with 10 being normal): 10/10 IMPRESSION: 1. Normal head CT. 2. Aspects is 10/10. The above was relayed via text pager to Dr. Doretta Gant on 06/10/2023 at 08:38 . Electronically Signed   By: Audree Leas M.D.   On: 06/10/2023 08:38    EKG: Independently reviewed. See above  Assessment/Plan TIA r/o CVA -left sided numbness / NIHS 1 - CT head negative for acute finding  -admit tia/cva r/o  -mri pending , -continue DAPT started in ED -neuro checks , SLP, PT/OT  -echo /carotids  per protocol  -f/u final neuro recs    Anxiety -resume home regimen     IBS -continue levisin    Hypothyroidism -continue synthroid     DVT prophylaxis: heparin Code Status: full/ as discussed per patient wishes in event of cardiac arrest  Family Communication: none at bedside Disposition Plan: patient  expected to be admitted greater than 2 midnights  Consults called: neuro  Dr Doretta Gant Admission status:   progressive   Sabas Cradle MD [facility]   If 7PM-7AM, please contact night-coverage [email or web address] Password [email or web address]  06/10/2023, 12:42 PM

## 2023-06-10 NOTE — ED Notes (Signed)
 MRI called to screen patient

## 2023-06-10 NOTE — ED Notes (Signed)
 Called CCMD for central monitoring

## 2023-06-10 NOTE — ED Provider Notes (Signed)
 [facility] Provider Note    Event Date/Time   First MD Initiated Contact with Patient 06/10/23 0815     (approximate)   History   Code Stroke  First nurse note: Pt reports woke up at 0400 completely normal. Pt states was at work and 0700 started having left forehead numbness that progressed to left arm and leg. Pt denies HA, vision or speech changes. Pt A&Ox4. Pt ambulatory to triage on arrival. No blood thinner. No hx of HTN. CODE STROKE called in triage.   Pt via POV from home. Pt states she felt fine this morning when she woke this AM at 0400. LKW 0700. Pt c/o sudden onset of L sided facial numbness, arm and leg numbness. Denies any weakness or facial droop. Denies any significant medical hx. Pt has a hx of migraines but denies headache at this time. Pt is A&Ox4 and NAD  CODE STROKE called in triage at this time.    HPI Kathleen Sims is a 63 y.o. female PMH hyperlipidemia, IBS, hypothyroidism presents for evaluation of numbness in the forehead and left arm and leg -Patient woke at 4 AM this morning, felt normal, went to work, noticed numbness of her forehead and also of her left arm and leg starting about 7 AM.  No headache.  No vision change.  Has not appreciated any obvious weakness. -Otherwise has been in her usual state of health and feeling well  POC glucose 109 at triage.  Code stroke activated.     Physical Exam   Triage Vital Signs: ED Triage Vitals  Encounter Vitals Group     BP 06/10/23 0813 (!) 165/87     Systolic BP Percentile --      Diastolic BP Percentile --      Pulse Rate 06/10/23 0813 71     Resp 06/10/23 0813 18     Temp 06/10/23 0813 98.8 F (37.1 C)     Temp Source 06/10/23 0813 Oral     SpO2 06/10/23 0813 96 %     Weight 06/10/23 0811 130 lb (59 kg)     Height 06/10/23 0811 5\' 3"  (1.6 m)     Head Circumference --      Peak Flow --      Pain Score 06/10/23 0811 0     Pain Loc --      Pain Education --      Exclude from  Growth Chart --     Most recent vital signs: Vitals:   06/10/23 1000 06/10/23 1100  BP: 137/84 123/82  Pulse: (!) 59 60  Resp: 11 16  Temp:    SpO2: 98% 96%     General: Awake, no distress.  CV:  Good peripheral perfusion. RRR Resp:  Normal effort.  Neuro:  Aox4, no focal deficits appreciated beyond decreased sensation forehead, left upper extremity, left lower extremity.  See NIH stroke scale below.   1a  Level of consciousness: 0=alert; keenly responsive  1b. LOC questions:  0=Performs both tasks correctly  1c. LOC commands: 0=Performs both tasks correctly  2.  Best Gaze: 0=normal  3.  Visual: 0=No visual loss  4. Facial Palsy: 0=Normal symmetric movement  5a.  Motor left arm: 0=No drift, limb holds 90 (or 45) degrees for full 10 seconds  5b.  Motor right arm: 0=No drift, limb holds 90 (or 45) degrees for full 10 seconds  6a. motor left leg: 0=No drift, limb holds 90 (or 45) degrees for full  10 seconds  6b  Motor right leg:  0=No drift, limb holds 90 (or 45) degrees for full 10 seconds  7. Limb Ataxia: 0=Absent  8.  Sensory: 1=Mild to moderate sensory loss; patient feels pinprick is less sharp or is dull on the affected side; there is a loss of superficial pain with pinprick but patient is aware She is being touched  9. Best Language:  0=No aphasia, normal  10. Dysarthria: 0=Normal  11. Extinction and Inattention: 0=No abnormality  12. Distal motor function: 0=Normal   Total:   1      ED Results / Procedures / Treatments   Labs (all labs ordered are listed, but only abnormal results are displayed) Labs Reviewed  COMPREHENSIVE METABOLIC PANEL WITH GFR - Abnormal; Notable for the following components:      Result Value   Glucose, Bld 113 (*)    BUN 24 (*)    Creatinine, Ser 1.14 (*)    GFR, Estimated 54 (*)    All other components within normal limits  CBG MONITORING, ED - Abnormal; Notable for the following components:   Glucose-Capillary 109 (*)    All other  components within normal limits  PROTIME-INR  APTT  CBC  DIFFERENTIAL  ETHANOL  I-STAT CREATININE, ED     EKG  See ED course.    RADIOLOGY Radiology interpreted by myself and radiology reports reviewed.  See ED course below.     PROCEDURES:  Critical Care performed: Yes, see critical care procedure note(s)  .Critical Care  Performed by: Collis Deaner, MD Authorized by: Collis Deaner, MD   Critical care provider statement:    Critical care time (minutes):  30   Critical care was necessary to treat or prevent imminent or life-threatening deterioration of the following conditions:  CNS failure or compromise (CODE STROKE)   Critical care was time spent personally by me on the following activities:  Development of treatment plan with patient or surrogate, discussions with consultants, evaluation of patient's response to treatment, examination of patient, ordering and review of laboratory studies, ordering and review of radiographic studies, ordering and performing treatments and interventions, pulse oximetry, re-evaluation of patient's condition and review of old charts    MEDICATIONS ORDERED IN ED: Medications  sodium chloride  flush (NS) 0.9 % injection 3 mL ( Intravenous Canceled Entry 06/10/23 0827)  aspirin chewable tablet 81 mg (has no administration in time range)  clopidogrel (PLAVIX) tablet 75 mg (has no administration in time range)     IMPRESSION / MDM / ASSESSMENT AND PLAN / ED COURSE  I reviewed the triage vital signs and the nursing notes.                              DDX/MDM/AP: Differential diagnosis includes, but is not limited to, CVA/TIA, consider underlying electrolyte abnormality, presentation not consistent with radiculopathy and patient does not appear particularly anxious on my eval.  Do not clinically suspect anginal equivalent.  Fortunately low stroke scale currently (1 only) for mild-moderate sensory loss.  Plan: - Code stroke activated,  appreciate neurology assistance - CT head - POC glucose 109 - Labs  -EKG - Cardiac monitoring - Reassess  Patient's presentation is most consistent with acute presentation with potential threat to life or bodily function.  The patient is on the cardiac monitor to evaluate for evidence of arrhythmia and/or significant heart rate changes.  ED course below.  Admitted for CVA workup.  Clinical Course as of 06/10/23 1128  Thu Jun 10, 2023  0824 Cbc wnl [MM]  [phone] Dr. Doretta Gant of neuro evaluated pt, recs: - defer TNK given low NIHSS (1), monitor in ED until outside of TNK window (I.e. 11:30 am), can administer DAPT at that time - q38min neuro checks while in ED - admit for CVA/TIA workup, ok for floor if no TNK given in that window [MM]  0832 CT head with no intracranial hemorrhage on my interpretation, no obvious evidence of CVA  Formal read pending [MM]  0848 CMP reviewed, overall unremarkable, very mild bump in creatinine compared to 1 year ago  EtOH negative [MM]  0849 Formal CTH read: IMPRESSION: 1. Normal head CT. 2. Aspects is 10/10.   [MM]  0850 Ecg = sinus rhythm, rate 61, no gross ST elevation or depression, no significant repolarization abnormality, axis, normal intervals.  No evidence of arrhythmia nor ischemia on my read. [MM]  [phone] Patient updated on findings and plan.  No change in symptoms. [MM]  1002 Patient reevaluated, still with no new symptom development [MM]  1128 Patient mains stable here, no indication for TNK, now outside of window  Treating with aspirin, Plavix  MRI ordered  Hospitalist consult order placed [MM]    Clinical Course User Index [MM] Collis Deaner, MD     FINAL CLINICAL IMPRESSION(S) / ED DIAGNOSES   Final diagnoses:  Cerebrovascular accident (CVA), unspecified mechanism (HCC)  Numbness and tingling in left arm  Numbness and tingling of left leg  Numbness of face     Rx / DC Orders   ED Discharge Orders     None         Note:  This document was prepared using Dragon voice recognition software and may include unintentional dictation errors.   Collis Deaner, MD 06/10/23 [phone]

## 2023-06-10 NOTE — ED Triage Notes (Signed)
 First nurse note: Pt reports woke up at 0400 completely normal. Pt states was at work and 0700 started having left forehead numbness that progressed to left arm and leg. Pt denies HA, vision or speech changes. Pt A&Ox4. Pt ambulatory to triage on arrival. No blood thinner. No hx of HTN. CODE STROKE called in triage.

## 2023-06-10 NOTE — ED Triage Notes (Addendum)
 Pt via POV from home. Pt states she felt fine this morning when she woke this AM at 0400. LKW 0700. Pt c/o sudden onset of L sided facial numbness, arm and leg numbness. Denies any weakness or facial droop. Denies any significant medical hx. Pt has a hx of migraines but denies headache at this time. Pt is A&Ox4 and NAD  CODE STROKE called in triage at this time.

## 2023-06-11 ENCOUNTER — Observation Stay

## 2023-06-11 DIAGNOSIS — R220 Localized swelling, mass and lump, head: Secondary | ICD-10-CM | POA: Diagnosis not present

## 2023-06-11 DIAGNOSIS — M47812 Spondylosis without myelopathy or radiculopathy, cervical region: Secondary | ICD-10-CM | POA: Diagnosis not present

## 2023-06-11 DIAGNOSIS — M50322 Other cervical disc degeneration at C5-C6 level: Secondary | ICD-10-CM | POA: Diagnosis not present

## 2023-06-11 DIAGNOSIS — G459 Transient cerebral ischemic attack, unspecified: Secondary | ICD-10-CM | POA: Diagnosis not present

## 2023-06-11 DIAGNOSIS — M50223 Other cervical disc displacement at C6-C7 level: Secondary | ICD-10-CM | POA: Diagnosis not present

## 2023-06-11 DIAGNOSIS — M4802 Spinal stenosis, cervical region: Secondary | ICD-10-CM | POA: Diagnosis not present

## 2023-06-11 DIAGNOSIS — E236 Other disorders of pituitary gland: Secondary | ICD-10-CM | POA: Diagnosis not present

## 2023-06-11 LAB — ECHOCARDIOGRAM COMPLETE
Area-P 1/2: 2.95 cm2
Height: 63 in
S' Lateral: 2.6 cm
Weight: 2080 [oz_av]

## 2023-06-11 LAB — LIPID PANEL
Cholesterol: 226 mg/dL — ABNORMAL HIGH (ref 0–200)
HDL: 58 mg/dL (ref >40)
LDL Cholesterol: 155 mg/dL — ABNORMAL HIGH (ref 0–99)
Total CHOL/HDL Ratio: 3.9 ratio
Triglycerides: 66 mg/dL (ref <150)
VLDL: 13 mg/dL (ref 0–40)

## 2023-06-11 LAB — HIV ANTIBODY (ROUTINE TESTING W REFLEX): HIV Screen 4th Generation wRfx: NONREACTIVE

## 2023-06-11 MED ORDER — IOHEXOL 350 MG/ML SOLN
75.0000 mL | Freq: Once | INTRAVENOUS | Status: AC | PRN
  Administered 2023-06-11: 75 mL via INTRAVENOUS

## 2023-06-11 MED ORDER — GADOBUTROL 1 MMOL/ML IV SOLN
5.0000 mL | Freq: Once | INTRAVENOUS | Status: AC | PRN
  Administered 2023-06-11: 5 mL via INTRAVENOUS

## 2023-06-11 NOTE — Progress Notes (Signed)
 Transition of Care [facility]) - Inpatient Brief Assessment   Patient Details  Name: Kathleen Sims MRN: [id number] Date of Birth: 10-30-1960  Transition of Care [facility]) CM/SW Contact:    Miyako Oelke C Duc Crocket, RN Phone Number: 06/11/2023, 3:44 PM   Clinical Narrative: TOC continuing to follow patient's progress throughout discharge planning.   Transition of Care Asessment: Insurance and Status: Insurance coverage has been reviewed Patient has primary care physician: Yes   Prior level of function:: independent Prior/Current Home Services: No current home services Social Drivers of Health Review: SDOH reviewed no interventions necessary Readmission risk has been reviewed: Yes Transition of care needs: no transition of care needs at this time

## 2023-06-11 NOTE — Plan of Care (Signed)
  Problem: Education: Goal: Knowledge of patient specific risk factors will improve (DELETE if not current risk factor) Outcome: Progressing   Problem: Ischemic Stroke/TIA Tissue Perfusion: Goal: Complications of ischemic stroke/TIA will be minimized Outcome: Progressing   Problem: Coping: Goal: Will verbalize positive feelings about self Outcome: Progressing

## 2023-06-11 NOTE — Progress Notes (Signed)
 PROGRESS NOTE    Kathleen Sims  MWN:[id number] DOB: 1960/06/03 DOA: 06/10/2023 PCP: Valli Gaw, MD      Brief Narrative:   From admission h and p  Kathleen Sims is a 63 y.o. female with medical history significant of  Anxiety, IBS, hypothyroidism who presents to ED with complaints of acute onset of left sided numbness for which code stroke was called. Patient notes no associated vision changes, HA, weakness, difficulty swallowing. She notes no prior episodes like this in the past.  Assessment & Plan:   Principal Problem:   TIA (transient ischemic attack) Active Problems:   Hypothyroid  # Left sided paresthesias Neuro evaluated in ER, concern for TIA. MRI negative. TTE negative. No events on tele. Normal pt/ot/slp evals. Cervical pathology? MS? - CTA head/neck - contrast-enhanced MRI of head and c spine   DVT prophylaxis: lovenox Code Status: full Family Communication: husband updated @ bedside  Level of care: Progressive Status is: Observation    Consultants:  neurology  Procedures: none  Antimicrobials:  none    Subjective: Reports ongoing paresthesias left arm and left leg  Objective: Vitals:   06/11/23 0325 06/11/23 0741 06/11/23 1138 06/11/23 1554  BP: 111/75 122/80 134/86 121/79  Pulse: 62 (!) 59 63 62  Resp: 18     Temp: 98.2 F (36.8 C) 97.9 F (36.6 C) 98.3 F (36.8 C) 98.8 F (37.1 C)  TempSrc: Oral     SpO2: 97% 99% 97% 97%  Weight:      Height:        Intake/Output Summary (Last 24 hours) at 06/11/2023 1704 Last data filed at 06/11/2023 0325 Gross per 24 hour  Intake 696.04 ml  Output --  Net 696.04 ml   Filed Weights   06/10/23 0811  Weight: 59 kg    Examination:  General exam: Appears calm and comfortable  Respiratory system: Clear to auscultation. Respiratory effort normal. Cardiovascular system: S1 & S2 heard, RRR. No JVD, murmurs, rubs, gallops or clicks. No pedal edema. Gastrointestinal system: Abdomen is nondistended,  soft and nontender. No organomegaly or masses felt.  Central nervous system: Alert and oriented. No focal neurological deficits. 5/5 upper and lower strength, distal sensation intact Extremities: Symmetric 5 x 5 power. Skin: No rashes, lesions or ulcers Psychiatry: Judgement and insight appear normal. Mood & affect appropriate.     Data Reviewed: I have personally reviewed following labs and imaging studies  CBC: Recent Labs  Lab 06/10/23 0813  WBC 5.4  NEUTROABS 2.9  HGB 13.3  HCT 39.2  MCV 92.5  PLT 332   Basic Metabolic Panel: Recent Labs  Lab 06/10/23 0813  NA 138  K 4.0  CL 101  CO2 26  GLUCOSE 113*  BUN 24*  CREATININE 1.14*  CALCIUM 9.3   GFR: Estimated Creatinine Clearance: 41.2 mL/min (A) (by C-G formula based on SCr of 1.14 mg/dL (H)). Liver Function Tests: Recent Labs  Lab 06/10/23 0813  AST 21  ALT 15  ALKPHOS 52  BILITOT 0.8  PROT 7.4  ALBUMIN 4.4   No results for input(s): "LIPASE", "AMYLASE" in the last 168 hours. No results for input(s): "AMMONIA" in the last 168 hours. Coagulation Profile: Recent Labs  Lab 06/10/23 0813  INR 1.1   Cardiac Enzymes: No results for input(s): "CKTOTAL", "CKMB", "CKMBINDEX", "TROPONINI" in the last 168 hours. BNP (last 3 results) No results for input(s): "PROBNP" in the last 8760 hours. HbA1C: Recent Labs    06/10/23 1608  HGBA1C 5.1  CBG: Recent Labs  Lab 06/10/23 0812  GLUCAP 109*   Lipid Profile: Recent Labs    06/11/23 0404  CHOL 226*  HDL 58  LDLCALC 155*  TRIG 66  CHOLHDL 3.9   Thyroid  Function Tests: No results for input(s): "TSH", "T4TOTAL", "FREET4", "T3FREE", "THYROIDAB" in the last 72 hours. Anemia Panel: No results for input(s): "VITAMINB12", "FOLATE", "FERRITIN", "TIBC", "IRON", "RETICCTPCT" in the last 72 hours. Urine analysis:    Component Value Date/Time   COLORURINE YELLOW (A) 07/14/2017 0602   APPEARANCEUR HAZY (A) 07/14/2017 0602   APPEARANCEUR Hazy 12/08/2012  0721   LABSPEC 1.017 07/14/2017 0602   LABSPEC 1.028 12/08/2012 0721   PHURINE 6.0 07/14/2017 0602   GLUCOSEU NEGATIVE 07/14/2017 0602   GLUCOSEU Negative 12/08/2012 0721   HGBUR MODERATE (A) 07/14/2017 0602   BILIRUBINUR NEGATIVE 07/14/2017 0602   BILIRUBINUR Negative 12/08/2012 0721   KETONESUR NEGATIVE 07/14/2017 0602   PROTEINUR NEGATIVE 07/14/2017 0602   NITRITE NEGATIVE 07/14/2017 0602   LEUKOCYTESUR NEGATIVE 07/14/2017 0602   LEUKOCYTESUR Negative 12/08/2012 0721   Sepsis Labs: @LABRCNTIP (procalcitonin:4,lacticidven:4)  )No results found for this or any previous visit (from the past 240 hours).       Radiology Studies: MR CERVICAL SPINE W WO CONTRAST Result Date: 06/11/2023 CLINICAL DATA:  Provided history: Left-sided paresthesias. EXAM: MRI HEAD WITH CONTRAST MRI CERVICAL SPINE WITHOUT AND WITH CONTRAST TECHNIQUE: Multiplanar, multiecho pulse sequences of the brain and surrounding structures were acquired following intravenous contrast administration. Multiplanar multi-echo pulse sequences of the cervical spine (to include the craniocervical junction and cervicothoracic junction) were acquired without and with intravenous contrast. COMPARISON:  Brain MRI 06/10/2023. FINDINGS: MRI HEAD FINDINGS Post-contrast MR imaging of the brain was acquired as a follow-up to the recent prior non-contrast brain MRI of 06/10/2023. The following sequences were acquired for today's exam: axial, coronal and sagittal T1-weighted post-contrast. Brain: 5 mm hypoenhancing focus within the right aspect of the pituitary gland (for instance as seen on series 1, image 57) (series 100, image 118). Elsewhere, no pathologic intracranial enhancement is identified. Vascular: Enhancement of the proximal large arterial vessels and dural venous sinuses. Skull and upper cervical spine: No focal worrisome marrow lesion. Sinuses/Orbits: No mass or acute finding within the imaged orbits. No significant paranasal sinus  disease. MRI CERVICAL SPINE FINDINGS Alignment: Nonspecific reversal of the expected cervical lordosis. Levocurvature of the cervical spine. 2 mm grade 1 anterolisthesis at C2-C3 and C3-C4. Slight C7-T1 grade 1 anterolisthesis. Vertebrae: Cervical vertebral body height is maintained. Mild degenerative endplate edema at W4-X3. No significant marrow edema or focal worrisome marrow lesion identified elsewhere. Facet ankylosis on the right at C3-C4. Facet ankylosis on the left at C4-C5. Cord: No signal abnormality identified within the cervical spinal cord. No pathologic spinal cord enhancement. Posterior Fossa, vertebral arteries, paraspinal tissues: Posterior fossa assessed on same-day brain MRI. Flow voids preserved within the visible cervical vertebral arteries. No paraspinal mass or collection. Disc levels: Multilevel disc degeneration, greatest at C5-C6 (moderate at this level). C2-C3: Mild grade 1 anterolisthesis. No significant spinal canal or foraminal stenosis. C3-C4: Mild grade 1 anterolisthesis. Facet ankylosis and hypertrophy on the right. No significant spinal canal stenosis. Mild right neural foraminal narrowing. C4-C5: Facet ankylosis and hypertrophy on the left. No significant disc herniation or stenosis. C5-C6: Posterior disc osteophyte complex with right-sided disc osteophyte ridge/uncinate hypertrophy. The posterior disc osteophyte complex effaces the ventral thecal sac and slightly flattens the ventral aspect of the spinal cord. However, the dorsal CSF space is maintained within  the spinal canal. Moderate-to-severe right neural foraminal narrowing. C6-C7: Small disc bulge. No significant spinal canal stenosis. Mild right neural foraminal narrowing. C7-T1: Mild grade 1 anterolisthesis. Facet degeneration (greater on the right and moderate on the right). No significant spinal canal stenosis. Mild right neural foraminal narrowing. IMPRESSION: MRI brain with contrast: 1. 5 mm hypoenhancing focus within  the pituitary gland suspicious for possible pituitary microadenoma. No further imaging evaluation or imaging follow-up is necessary. Consider endocrine function tests and correlate for a history of pituitary hypersecretion. This follows ACR consensus guidelines: Management of Incidental Pituitary Findings on CT, MRI and F18-FDG PET: A White Paper of the ACR Incidental Findings Committee. J Am Coll Radiol 2018; 15: 409-81. 2. Elsewhere, no pathologic intracranial enhancement is identified. MRI cervical spine without and with contrast: 1. No lesion is identified within the cervical spinal cord. 2. Cervical spondylosis as outlined within the body of the report. 3. At C5-C6, there is moderate disc degeneration (with mild degenerative endplate edema). Posterior disc osteophyte complex with right-sided disc osteophyte ridge/uncinate hypertrophy. The posterior disc osteophyte complex effaces the ventral thecal sac and slightly flattens the ventral aspect of the spinal cord. However, the dorsal CSF space is maintained within the spinal canal. Moderate-to-severe right neural foraminal narrowing. Correlate for right C6 radiculopathy. 4. No significant spinal canal stenosis at the remaining levels. 5. No more than mild foraminal stenosis at the remaining levels. 6. Facet ankylosis on the right at C3-C4, and on the left at C4-C5. 7. Levocurvature of the cervical spine. Electronically Signed   By: Bascom Lily D.O.   On: 06/11/2023 16:51   MR BRAIN W CONTRAST Result Date: 06/11/2023 CLINICAL DATA:  Provided history: Left-sided paresthesias. EXAM: MRI HEAD WITH CONTRAST MRI CERVICAL SPINE WITHOUT AND WITH CONTRAST TECHNIQUE: Multiplanar, multiecho pulse sequences of the brain and surrounding structures were acquired following intravenous contrast administration. Multiplanar multi-echo pulse sequences of the cervical spine (to include the craniocervical junction and cervicothoracic junction) were acquired without and with  intravenous contrast. COMPARISON:  Brain MRI 06/10/2023. FINDINGS: MRI HEAD FINDINGS Post-contrast MR imaging of the brain was acquired as a follow-up to the recent prior non-contrast brain MRI of 06/10/2023. The following sequences were acquired for today's exam: axial, coronal and sagittal T1-weighted post-contrast. Brain: 5 mm hypoenhancing focus within the right aspect of the pituitary gland (for instance as seen on series 1, image 57) (series 100, image 118). Elsewhere, no pathologic intracranial enhancement is identified. Vascular: Enhancement of the proximal large arterial vessels and dural venous sinuses. Skull and upper cervical spine: No focal worrisome marrow lesion. Sinuses/Orbits: No mass or acute finding within the imaged orbits. No significant paranasal sinus disease. MRI CERVICAL SPINE FINDINGS Alignment: Nonspecific reversal of the expected cervical lordosis. Levocurvature of the cervical spine. 2 mm grade 1 anterolisthesis at C2-C3 and C3-C4. Slight C7-T1 grade 1 anterolisthesis. Vertebrae: Cervical vertebral body height is maintained. Mild degenerative endplate edema at X9-J4. No significant marrow edema or focal worrisome marrow lesion identified elsewhere. Facet ankylosis on the right at C3-C4. Facet ankylosis on the left at C4-C5. Cord: No signal abnormality identified within the cervical spinal cord. No pathologic spinal cord enhancement. Posterior Fossa, vertebral arteries, paraspinal tissues: Posterior fossa assessed on same-day brain MRI. Flow voids preserved within the visible cervical vertebral arteries. No paraspinal mass or collection. Disc levels: Multilevel disc degeneration, greatest at C5-C6 (moderate at this level). C2-C3: Mild grade 1 anterolisthesis. No significant spinal canal or foraminal stenosis. C3-C4: Mild grade 1 anterolisthesis. Facet ankylosis  and hypertrophy on the right. No significant spinal canal stenosis. Mild right neural foraminal narrowing. C4-C5: Facet ankylosis  and hypertrophy on the left. No significant disc herniation or stenosis. C5-C6: Posterior disc osteophyte complex with right-sided disc osteophyte ridge/uncinate hypertrophy. The posterior disc osteophyte complex effaces the ventral thecal sac and slightly flattens the ventral aspect of the spinal cord. However, the dorsal CSF space is maintained within the spinal canal. Moderate-to-severe right neural foraminal narrowing. C6-C7: Small disc bulge. No significant spinal canal stenosis. Mild right neural foraminal narrowing. C7-T1: Mild grade 1 anterolisthesis. Facet degeneration (greater on the right and moderate on the right). No significant spinal canal stenosis. Mild right neural foraminal narrowing. IMPRESSION: MRI brain with contrast: 1. 5 mm hypoenhancing focus within the pituitary gland suspicious for possible pituitary microadenoma. No further imaging evaluation or imaging follow-up is necessary. Consider endocrine function tests and correlate for a history of pituitary hypersecretion. This follows ACR consensus guidelines: Management of Incidental Pituitary Findings on CT, MRI and F18-FDG PET: A White Paper of the ACR Incidental Findings Committee. J Am Coll Radiol 2018; 15: 295-62. 2. Elsewhere, no pathologic intracranial enhancement is identified. MRI cervical spine without and with contrast: 1. No lesion is identified within the cervical spinal cord. 2. Cervical spondylosis as outlined within the body of the report. 3. At C5-C6, there is moderate disc degeneration (with mild degenerative endplate edema). Posterior disc osteophyte complex with right-sided disc osteophyte ridge/uncinate hypertrophy. The posterior disc osteophyte complex effaces the ventral thecal sac and slightly flattens the ventral aspect of the spinal cord. However, the dorsal CSF space is maintained within the spinal canal. Moderate-to-severe right neural foraminal narrowing. Correlate for right C6 radiculopathy. 4. No significant  spinal canal stenosis at the remaining levels. 5. No more than mild foraminal stenosis at the remaining levels. 6. Facet ankylosis on the right at C3-C4, and on the left at C4-C5. 7. Levocurvature of the cervical spine. Electronically Signed   By: Bascom Lily D.O.   On: 06/11/2023 16:51   ECHOCARDIOGRAM COMPLETE Result Date: 06/11/2023    ECHOCARDIOGRAM REPORT   Patient Name:   Kathleen Sims Date of Exam: 06/10/2023 Medical Rec #:  [id number]    Height:       63.0 in Accession #:    [id number]   Weight:       130.0 lb Date of Birth:  1960/12/08     BSA:          1.610 m Patient Age:    63 years     BP:           136/81 mmHg Patient Gender: F            HR:           64 bpm. Exam Location:  [facility] Procedure: 2D Echo (Both Spectral and Color Flow Doppler were utilized during            procedure). Indications:     TIA  History:         Patient has no prior history of Echocardiogram examinations.                  Risk Factors:Dyslipidemia.  Sonographer:     Dione Franks RDCS Referring Phys:  [id number] Wiliam Harder A THOMAS Diagnosing Phys: Belva Boyden MD IMPRESSIONS  1. Left ventricular ejection fraction, by estimation, is 60 to 65%. The left ventricle has normal function. The left ventricle has no regional wall motion abnormalities. Left ventricular diastolic parameters  are consistent with Grade I diastolic dysfunction (impaired relaxation).  2. Right ventricular systolic function is normal. The right ventricular size is normal. Tricuspid regurgitation signal is inadequate for assessing PA pressure.  3. The mitral valve is normal in structure. Mild mitral valve regurgitation. No evidence of mitral stenosis.  4. The aortic valve is tricuspid. Aortic valve regurgitation is not visualized. No aortic stenosis is present.  5. The inferior vena cava is normal in size with greater than 50% respiratory variability, suggesting right atrial pressure of 3 mmHg. FINDINGS  Left Ventricle: Left ventricular ejection fraction, by  estimation, is 60 to 65%. The left ventricle has normal function. The left ventricle has no regional wall motion abnormalities. Strain was performed and the global longitudinal strain is indeterminate. The left ventricular internal cavity size was normal in size. There is no left ventricular hypertrophy. Left ventricular diastolic parameters are consistent with Grade I diastolic dysfunction (impaired relaxation). Right Ventricle: The right ventricular size is normal. No increase in right ventricular wall thickness. Right ventricular systolic function is normal. Tricuspid regurgitation signal is inadequate for assessing PA pressure. Left Atrium: Left atrial size was normal in size. Right Atrium: Right atrial size was normal in size. Pericardium: There is no evidence of pericardial effusion. Mitral Valve: The mitral valve is normal in structure. Mild mitral valve regurgitation. No evidence of mitral valve stenosis. Tricuspid Valve: The tricuspid valve is normal in structure. Tricuspid valve regurgitation is not demonstrated. No evidence of tricuspid stenosis. Aortic Valve: The aortic valve is tricuspid. Aortic valve regurgitation is not visualized. No aortic stenosis is present. Pulmonic Valve: The pulmonic valve was normal in structure. Pulmonic valve regurgitation is not visualized. No evidence of pulmonic stenosis. Aorta: The aortic root is normal in size and structure. Venous: The inferior vena cava is normal in size with greater than 50% respiratory variability, suggesting right atrial pressure of 3 mmHg. IAS/Shunts: No atrial level shunt detected by color flow Doppler. Additional Comments: 3D was performed not requiring image post processing on an independent workstation and was indeterminate.  LEFT VENTRICLE PLAX 2D LVIDd:         4.10 cm   Diastology LVIDs:         2.60 cm   LV e' medial:    6.74 cm/s LV PW:         0.90 cm   LV E/e' medial:  9.0 LV IVS:        0.80 cm   LV e' lateral:   11.70 cm/s LVOT diam:      1.80 cm   LV E/e' lateral: 5.2 LV SV:         46 LV SV Index:   28 LVOT Area:     2.54 cm  RIGHT VENTRICLE             IVC RV Basal diam:  2.40 cm     IVC diam: 1.20 cm RV S prime:     12.00 cm/s TAPSE (M-mode): 2.1 cm LEFT ATRIUM           Index        RIGHT ATRIUM           Index LA diam:      3.00 cm 1.86 cm/m   RA Area:     10.20 cm LA Vol (A4C): 20.9 ml 12.98 ml/m  RA Volume:   20.80 ml  12.92 ml/m  AORTIC VALVE LVOT Vmax:   91.70 cm/s LVOT Vmean:  54.500 cm/s LVOT  VTI:    0.180 m  AORTA Ao Root diam: 3.00 cm Ao Asc diam:  3.00 cm MITRAL VALVE MV Area (PHT): 2.95 cm    SHUNTS MV Decel Time: 257 msec    Systemic VTI:  0.18 m MV E velocity: 60.80 cm/s  Systemic Diam: 1.80 cm MV A velocity: 58.20 cm/s MV E/A ratio:  1.04 Belva Boyden MD Electronically signed by Belva Boyden MD Signature Date/Time: 06/11/2023/8:25:54 AM    Final    MR BRAIN WO CONTRAST Result Date: 06/10/2023 CLINICAL DATA:  Left-sided forehead, arm, and leg numbness. Concern for stroke. EXAM: MRI HEAD WITHOUT CONTRAST TECHNIQUE: Multiplanar, multiecho pulse sequences of the brain and surrounding structures were obtained without intravenous contrast. COMPARISON:  Head CT 06/10/2023 FINDINGS: Brain: There is no evidence of an acute infarct, intracranial hemorrhage, mass, midline shift, or extra-axial fluid collection. Cerebral volume is normal. The ventricles are normal in size. Cerebral white matter T2 hyperintensities are nonspecific but compatible with minimal chronic small vessel ischemic disease. Vascular: Major intracranial vascular flow voids are preserved. Skull and upper cervical spine: Unremarkable bone marrow signal. Sinuses/Orbits: Unremarkable orbits. Paranasal sinuses and mastoid air cells are clear. Other: None. IMPRESSION: 1. No acute intracranial abnormality. 2. Minimal chronic small vessel ischemic disease. Electronically Signed   By: Aundra Lee M.D.   On: 06/10/2023 15:02   CT HEAD CODE STROKE WO  CONTRAST Result Date: 06/10/2023 CLINICAL DATA:  Code stroke. Hand numbness beginning today. Left forehead numbness which extended into the left upper and lower extremity. EXAM: CT HEAD WITHOUT CONTRAST TECHNIQUE: Contiguous axial images were obtained from the base of the skull through the vertex without intravenous contrast. RADIATION DOSE REDUCTION: This exam was performed according to the departmental dose-optimization program which includes automated exposure control, adjustment of the mA and/or kV according to patient size and/or use of iterative reconstruction technique. COMPARISON:  None Available. FINDINGS: Brain: No acute infarct, hemorrhage, or mass lesion is present. No significant white matter lesions are present. Deep brain nuclei are within normal limits. The ventricles are of normal size. No significant extraaxial fluid collection is present. Midline structures are within normal limits. The brainstem and cerebellum are within normal limits. Vascular: No hyperdense vessel or unexpected calcification. Skull: Calvarium is intact. No focal lytic or blastic lesions are present. No significant extracranial soft tissue lesion is present. Sinuses/Orbits: The paranasal sinuses and mastoid air cells are clear. The globes and orbits are within normal limits. ASPECTS [facility] Stroke Program Early CT Score) - Ganglionic level infarction (caudate, lentiform nuclei, internal capsule, insula, M1-M3 cortex): 7/7 - Supraganglionic infarction (M4-M6 cortex): 3/3 Total score (0-10 with 10 being normal): 10/10 IMPRESSION: 1. Normal head CT. 2. Aspects is 10/10. The above was relayed via text pager to Dr. Doretta Gant on 06/10/2023 at 08:38 . Electronically Signed   By: Audree Leas M.D.   On: 06/10/2023 08:38        Scheduled Meds:   stroke: early stages of recovery book   Does not apply Once   aspirin  81 mg Oral Daily   calcium carbonate  500 mg Oral Daily   clopidogrel  75 mg Oral Daily   docusate sodium   100 mg Oral Daily   heparin  5,000 Units Subcutaneous Q8H   levothyroxine   125 mcg Oral QAC breakfast   sodium chloride  flush  3 mL Intravenous Once   Continuous Infusions:   LOS: 1 day     Seydou Hearns B Chloe Miyoshi, MD [facility]   If  7PM-7AM, please contact night-coverage [email or web address] Password [email or web address] 06/11/2023, 5:04 PM

## 2023-06-11 NOTE — Progress Notes (Addendum)
 SLP Cancellation Note  Patient Details Name: Kathleen Sims MRN: [id number] DOB: [birth year]   Cancelled treatment:       Reason Eval/Treat Not Completed: SLP screened, no needs identified, will sign off (reviewed chart; consulted NSG and met w/ pt and Family in room.)  Pt admitted to ED with complaints of acute onset of left sided numbness; NIH score 1, sensory. Stroke workup initiated at admit.  Pt A/O x4. She denied any difficulty swallowing and is currently on a regular diet; tolerates swallowing pills w/ water per NSG. Pt conversed in conversation w/out expressive/receptive deficits noted; pt denied any speech-language deficits. Speech clear, intelligible.  No skilled ST services indicated as pt appears at her baseline. Pt agreed. NSG to reconsult if any change in status while admitted.      Darla Edward, MS, CCC-SLP Speech Language Pathologist Rehab Services; [facility] [phone] (ascom) Kathleen Sims 06/11/2023, 8:44 AM

## 2023-06-11 NOTE — Care Management Obs Status (Signed)
 MEDICARE OBSERVATION STATUS NOTIFICATION   Patient Details  Name: Kathleen Sims MRN: [id number] Date of Birth: 1961-01-09   Medicare Observation Status Notification Given:  Yes    Anise Kerns 06/11/2023, 1:41 PM

## 2023-06-11 NOTE — *Deleted (Incomplete)
*  IN PROGRESS* MRI brain wo contrast CTA 1. No acute intracranial abnormality. 2. Minimal chronic small vessel ischemic disease.  TTE  1. Left ventricular ejection fraction, by estimation, is 60 to 65%. The  left ventricle has normal function. The left ventricle has no regional  wall motion abnormalities. Left ventricular diastolic parameters are  consistent with Grade I diastolic  dysfunction (impaired relaxation).   2. Right ventricular systolic function is normal. The right ventricular  size is normal. Tricuspid regurgitation signal is inadequate for assessing  PA pressure.   3. The mitral valve is normal in structure. Mild mitral valve  regurgitation. No evidence of mitral stenosis.   4. The aortic valve is tricuspid. Aortic valve regurgitation is not  visualized. No aortic stenosis is present.   5. The inferior vena cava is normal in size with greater than 50%  respiratory variability, suggesting right atrial pressure of 3 mmHg.   - CTA pending  C/f demyelination? - MRI w contrast - MRI c spine wwo

## 2023-06-12 ENCOUNTER — Encounter: Payer: Self-pay | Admitting: Family Medicine

## 2023-06-12 DIAGNOSIS — D497 Neoplasm of unspecified behavior of endocrine glands and other parts of nervous system: Secondary | ICD-10-CM | POA: Insufficient documentation

## 2023-06-12 DIAGNOSIS — G459 Transient cerebral ischemic attack, unspecified: Secondary | ICD-10-CM | POA: Diagnosis not present

## 2023-06-12 MED ORDER — ASPIRIN 81 MG PO CHEW: 81.0000 mg | CHEWABLE_TABLET | Freq: Every day | ORAL | Status: DC

## 2023-06-12 MED ORDER — ATORVASTATIN CALCIUM 40 MG PO TABS: 40.0000 mg | ORAL_TABLET | Freq: Every day | ORAL | 1 refills | Status: DC

## 2023-06-12 NOTE — TOC CM/SW Note (Signed)
 Patient aware of discharge orders, husband at bedside to provide transportation home.  Denies any TOC needs.   Holland Lundborg, RN, MSN, CCM TOC, RNCM

## 2023-06-12 NOTE — Discharge Summary (Signed)
 Kathleen Sims:[id number] DOB: [birth year] DOA: 06/10/2023  PCP: Valli Gaw, MD  Admit date: 06/10/2023 Discharge date: 06/12/2023  Time spent: 35 minutes  Recommendations for Outpatient Follow-up:  Neurology f/u, consider repeat MRI  PCP f/u, follow-up prolactin (pending at time of d/c)    Discharge Diagnoses:  Principal Problem:   TIA (transient ischemic attack) Active Problems:   Hypothyroid   Pituitary incidentaloma   Discharge Condition: stable  Diet recommendation: heart healthy  Filed Weights   06/10/23 0811  Weight: 59 kg    History of present illness:  From admission h and p Kathleen Sims is a 63 y.o. female with medical history significant of  Anxiety, IBS, hypothyroidism who presents to ED with complaints of acute onset of left sided numbness for which code stroke was called. Patient notes no associated vision changes, HA, weakness, difficulty swallowing. She notes no prior episodes like this in the past.  Hospital Course:  Patient presents with numbness center of forehead along with paresthesias left arm and leg. Evaluated with MRI of head and neck, CTA of head and neck, echocardiogram, cardiac monitoring, and labs. W/u has been unrevealing. Incidental pituitary mass discovered, prolactin obtained and is pending. Patient's symptoms have improved. Neurology has seen, advises neurology f/u in 2 weeks, per Dr. Doretta Gant this could represent a small thalamic stroke not visualized on MRI, may need repeat imaging in a few weeks. Atypical migraine is also a consideration. Will treat for possible CVA with aspirin and a statin as LDL is >70.   Procedures: none   Consultations: neurology  Discharge Exam: Vitals:   06/12/23 0449 06/12/23 0800  BP: 101/64 113/73  Pulse: (!) 59 61  Resp: 14 20  Temp: 97.9 F (36.6 C) 98.3 F (36.8 C)  SpO2: 97% 98%    General: NAD Cardiovascular: RRR Respiratory: CTAB Neuro: 5/5 upper/lower strength, cn 2-12 grossly  intact  Discharge Instructions   Discharge Instructions     Ambulatory referral to Neurology   Complete by: As directed    Diet - low sodium heart healthy   Complete by: As directed    Increase activity slowly   Complete by: As directed       Allergies as of 06/12/2023       Reactions   Amoxicillin    Other reaction(s): Unknown   Lansoprazole Other (See Comments)   Other reaction(s Intolerance on daily bases   Penicillins Hives   Childhood    Other Rash        Medication List     TAKE these medications    aspirin 81 MG chewable tablet Chew 1 tablet (81 mg total) by mouth daily.   atorvastatin 40 MG tablet Commonly known as: Lipitor Take 1 tablet (40 mg total) by mouth daily.   Calcium 250 MG Caps Take 250 mg by mouth daily.   docusate sodium 100 MG capsule Commonly known as: COLACE Take 100 mg by mouth daily.   Hyoscyamine Sulfate SL 0.125 MG Subl Place under the tongue.   levothyroxine  125 MCG tablet Commonly known as: Synthroid  Take 1 tablet (125 mcg total) by mouth daily before breakfast.       Allergies  Allergen Reactions   Amoxicillin     Other reaction(s): Unknown   Lansoprazole Other (See Comments)    Other reaction(s Intolerance on daily bases   Penicillins Hives    Childhood    Other Rash    Follow-up Information     Valli Gaw, MD Follow  up.   Specialty: Family Medicine Why: 1-2 weeks Contact information: [address] [place] [address] [phone]         Bufford Carne, MD Follow up.   Specialty: Neurology Why: in about 2 weeks Contact information: [address] [facility] [place] [address] [phone]                  The results of significant diagnostics from this hospitalization (including imaging, microbiology, ancillary and laboratory) are listed below for reference.    Significant Diagnostic Studies: CT ANGIO HEAD NECK W WO CM Result Date:  06/11/2023 CLINICAL DATA:  acute neuro def EXAM: CT ANGIOGRAPHY HEAD AND NECK WITH AND WITHOUT CONTRAST TECHNIQUE: Multidetector CT imaging of the head and neck was performed using the standard protocol during bolus administration of intravenous contrast. Multiplanar CT image reconstructions and MIPs were obtained to evaluate the vascular anatomy. Carotid stenosis measurements (when applicable) are obtained utilizing NASCET criteria, using the distal internal carotid diameter as the denominator. RADIATION DOSE REDUCTION: This exam was performed according to the departmental dose-optimization program which includes automated exposure control, adjustment of the mA and/or kV according to patient size and/or use of iterative reconstruction technique. CONTRAST:  75mL OMNIPAQUE  IOHEXOL  350 MG/ML SOLN COMPARISON:  None Available. FINDINGS: CT HEAD FINDINGS Brain: Pituitary mass better seen on recent MRI. Otherwise, no evidence of acute infarction, hemorrhage, hydrocephalus, extra-axial collection or mass lesion/mass effect. Vascular: See below. Skull: No acute fracture. Sinuses/Orbits: Clear sinuses.  No acute orbital findings. Other: No mastoid effusions. Review of the MIP images confirms the above findings CTA NECK FINDINGS Aortic arch: Great vessel origins are patent without significant stenosis. Right carotid system: No evidence of dissection, stenosis (50% or greater), or occlusion. Left carotid system: No evidence of dissection, stenosis (50% or greater), or occlusion. Vertebral arteries: Left dominant. No evidence of dissection, stenosis (50% or greater), or occlusion. Skeleton: No evidence of acute abnormality on limited assessment. Other neck: No acute abnormality on limited assessment. Upper chest: Visualized lung apices are clear. Review of the MIP images confirms the above findings CTA HEAD FINDINGS Anterior circulation: Bilateral intracranial ICAs, MCAs, and ACAs are patent without proximal hemodynamically  significant stenosis. Partially azygos ACA, anatomic variant. Posterior circulation: Bilateral intradural vertebral arteries, basilar artery and bilateral post cerebral arteries are patent without proximal hemodynamically significant stenosis. Venous sinuses: As permitted by contrast timing, patent. Anatomic variants: Not well assessed due to arterial timing. Review of the MIP images confirms the above findings IMPRESSION: No large vessel occlusion or proximal hemodynamically significant stenosis. Electronically Signed   By: Stevenson Elbe M.D.   On: 06/11/2023 19:40   MR CERVICAL SPINE W WO CONTRAST Result Date: 06/11/2023 CLINICAL DATA:  Provided history: Left-sided paresthesias. EXAM: MRI HEAD WITH CONTRAST MRI CERVICAL SPINE WITHOUT AND WITH CONTRAST TECHNIQUE: Multiplanar, multiecho pulse sequences of the brain and surrounding structures were acquired following intravenous contrast administration. Multiplanar multi-echo pulse sequences of the cervical spine (to include the craniocervical junction and cervicothoracic junction) were acquired without and with intravenous contrast. COMPARISON:  Brain MRI 06/10/2023. FINDINGS: MRI HEAD FINDINGS Post-contrast MR imaging of the brain was acquired as a follow-up to the recent prior non-contrast brain MRI of 06/10/2023. The following sequences were acquired for today's exam: axial, coronal and sagittal T1-weighted post-contrast. Brain: 5 mm hypoenhancing focus within the right aspect of the pituitary gland (for instance as seen on series 1, image 57) (series 100, image 118). Elsewhere, no pathologic intracranial enhancement  is identified. Vascular: Enhancement of the proximal large arterial vessels and dural venous sinuses. Skull and upper cervical spine: No focal worrisome marrow lesion. Sinuses/Orbits: No mass or acute finding within the imaged orbits. No significant paranasal sinus disease. MRI CERVICAL SPINE FINDINGS Alignment: Nonspecific reversal of the  expected cervical lordosis. Levocurvature of the cervical spine. 2 mm grade 1 anterolisthesis at C2-C3 and C3-C4. Slight C7-T1 grade 1 anterolisthesis. Vertebrae: Cervical vertebral body height is maintained. Mild degenerative endplate edema at N8-G9. No significant marrow edema or focal worrisome marrow lesion identified elsewhere. Facet ankylosis on the right at C3-C4. Facet ankylosis on the left at C4-C5. Cord: No signal abnormality identified within the cervical spinal cord. No pathologic spinal cord enhancement. Posterior Fossa, vertebral arteries, paraspinal tissues: Posterior fossa assessed on same-day brain MRI. Flow voids preserved within the visible cervical vertebral arteries. No paraspinal mass or collection. Disc levels: Multilevel disc degeneration, greatest at C5-C6 (moderate at this level). C2-C3: Mild grade 1 anterolisthesis. No significant spinal canal or foraminal stenosis. C3-C4: Mild grade 1 anterolisthesis. Facet ankylosis and hypertrophy on the right. No significant spinal canal stenosis. Mild right neural foraminal narrowing. C4-C5: Facet ankylosis and hypertrophy on the left. No significant disc herniation or stenosis. C5-C6: Posterior disc osteophyte complex with right-sided disc osteophyte ridge/uncinate hypertrophy. The posterior disc osteophyte complex effaces the ventral thecal sac and slightly flattens the ventral aspect of the spinal cord. However, the dorsal CSF space is maintained within the spinal canal. Moderate-to-severe right neural foraminal narrowing. C6-C7: Small disc bulge. No significant spinal canal stenosis. Mild right neural foraminal narrowing. C7-T1: Mild grade 1 anterolisthesis. Facet degeneration (greater on the right and moderate on the right). No significant spinal canal stenosis. Mild right neural foraminal narrowing. IMPRESSION: MRI brain with contrast: 1. 5 mm hypoenhancing focus within the pituitary gland suspicious for possible pituitary microadenoma. No  further imaging evaluation or imaging follow-up is necessary. Consider endocrine function tests and correlate for a history of pituitary hypersecretion. This follows ACR consensus guidelines: Management of Incidental Pituitary Findings on CT, MRI and F18-FDG PET: A White Paper of the ACR Incidental Findings Committee. J Am Coll Radiol 2018; 15: 562-13. 2. Elsewhere, no pathologic intracranial enhancement is identified. MRI cervical spine without and with contrast: 1. No lesion is identified within the cervical spinal cord. 2. Cervical spondylosis as outlined within the body of the report. 3. At C5-C6, there is moderate disc degeneration (with mild degenerative endplate edema). Posterior disc osteophyte complex with right-sided disc osteophyte ridge/uncinate hypertrophy. The posterior disc osteophyte complex effaces the ventral thecal sac and slightly flattens the ventral aspect of the spinal cord. However, the dorsal CSF space is maintained within the spinal canal. Moderate-to-severe right neural foraminal narrowing. Correlate for right C6 radiculopathy. 4. No significant spinal canal stenosis at the remaining levels. 5. No more than mild foraminal stenosis at the remaining levels. 6. Facet ankylosis on the right at C3-C4, and on the left at C4-C5. 7. Levocurvature of the cervical spine. Electronically Signed   By: Bascom Lily D.O.   On: 06/11/2023 16:51   MR BRAIN W CONTRAST Result Date: 06/11/2023 CLINICAL DATA:  Provided history: Left-sided paresthesias. EXAM: MRI HEAD WITH CONTRAST MRI CERVICAL SPINE WITHOUT AND WITH CONTRAST TECHNIQUE: Multiplanar, multiecho pulse sequences of the brain and surrounding structures were acquired following intravenous contrast administration. Multiplanar multi-echo pulse sequences of the cervical spine (to include the craniocervical junction and cervicothoracic junction) were acquired without and with intravenous contrast. COMPARISON:  Brain MRI 06/10/2023. FINDINGS:  MRI HEAD  FINDINGS Post-contrast MR imaging of the brain was acquired as a follow-up to the recent prior non-contrast brain MRI of 06/10/2023. The following sequences were acquired for today's exam: axial, coronal and sagittal T1-weighted post-contrast. Brain: 5 mm hypoenhancing focus within the right aspect of the pituitary gland (for instance as seen on series 1, image 57) (series 100, image 118). Elsewhere, no pathologic intracranial enhancement is identified. Vascular: Enhancement of the proximal large arterial vessels and dural venous sinuses. Skull and upper cervical spine: No focal worrisome marrow lesion. Sinuses/Orbits: No mass or acute finding within the imaged orbits. No significant paranasal sinus disease. MRI CERVICAL SPINE FINDINGS Alignment: Nonspecific reversal of the expected cervical lordosis. Levocurvature of the cervical spine. 2 mm grade 1 anterolisthesis at C2-C3 and C3-C4. Slight C7-T1 grade 1 anterolisthesis. Vertebrae: Cervical vertebral body height is maintained. Mild degenerative endplate edema at O1-H0. No significant marrow edema or focal worrisome marrow lesion identified elsewhere. Facet ankylosis on the right at C3-C4. Facet ankylosis on the left at C4-C5. Cord: No signal abnormality identified within the cervical spinal cord. No pathologic spinal cord enhancement. Posterior Fossa, vertebral arteries, paraspinal tissues: Posterior fossa assessed on same-day brain MRI. Flow voids preserved within the visible cervical vertebral arteries. No paraspinal mass or collection. Disc levels: Multilevel disc degeneration, greatest at C5-C6 (moderate at this level). C2-C3: Mild grade 1 anterolisthesis. No significant spinal canal or foraminal stenosis. C3-C4: Mild grade 1 anterolisthesis. Facet ankylosis and hypertrophy on the right. No significant spinal canal stenosis. Mild right neural foraminal narrowing. C4-C5: Facet ankylosis and hypertrophy on the left. No significant disc herniation or stenosis.  C5-C6: Posterior disc osteophyte complex with right-sided disc osteophyte ridge/uncinate hypertrophy. The posterior disc osteophyte complex effaces the ventral thecal sac and slightly flattens the ventral aspect of the spinal cord. However, the dorsal CSF space is maintained within the spinal canal. Moderate-to-severe right neural foraminal narrowing. C6-C7: Small disc bulge. No significant spinal canal stenosis. Mild right neural foraminal narrowing. C7-T1: Mild grade 1 anterolisthesis. Facet degeneration (greater on the right and moderate on the right). No significant spinal canal stenosis. Mild right neural foraminal narrowing. IMPRESSION: MRI brain with contrast: 1. 5 mm hypoenhancing focus within the pituitary gland suspicious for possible pituitary microadenoma. No further imaging evaluation or imaging follow-up is necessary. Consider endocrine function tests and correlate for a history of pituitary hypersecretion. This follows ACR consensus guidelines: Management of Incidental Pituitary Findings on CT, MRI and F18-FDG PET: A White Paper of the ACR Incidental Findings Committee. J Am Coll Radiol 2018; 15: 865-78. 2. Elsewhere, no pathologic intracranial enhancement is identified. MRI cervical spine without and with contrast: 1. No lesion is identified within the cervical spinal cord. 2. Cervical spondylosis as outlined within the body of the report. 3. At C5-C6, there is moderate disc degeneration (with mild degenerative endplate edema). Posterior disc osteophyte complex with right-sided disc osteophyte ridge/uncinate hypertrophy. The posterior disc osteophyte complex effaces the ventral thecal sac and slightly flattens the ventral aspect of the spinal cord. However, the dorsal CSF space is maintained within the spinal canal. Moderate-to-severe right neural foraminal narrowing. Correlate for right C6 radiculopathy. 4. No significant spinal canal stenosis at the remaining levels. 5. No more than mild foraminal  stenosis at the remaining levels. 6. Facet ankylosis on the right at C3-C4, and on the left at C4-C5. 7. Levocurvature of the cervical spine. Electronically Signed   By: Bascom Lily D.O.   On: 06/11/2023 16:51   ECHOCARDIOGRAM COMPLETE Result  Date: 06/11/2023    ECHOCARDIOGRAM REPORT   Patient Name:   Kathleen Sims Date of Exam: 06/10/2023 Medical Rec #:  [id number]    Height:       63.0 in Accession #:    [id number]   Weight:       130.0 lb Date of Birth:  Nov 24, 1960     BSA:          1.610 m Patient Age:    63 years     BP:           136/81 mmHg Patient Gender: F            HR:           64 bpm. Exam Location:  [facility] Procedure: 2D Echo (Both Spectral and Color Flow Doppler were utilized during            procedure). Indications:     TIA  History:         Patient has no prior history of Echocardiogram examinations.                  Risk Factors:Dyslipidemia.  Sonographer:     Dione Franks RDCS Referring Phys:  [id number] Wiliam Harder A THOMAS Diagnosing Phys: Belva Boyden MD IMPRESSIONS  1. Left ventricular ejection fraction, by estimation, is 60 to 65%. The left ventricle has normal function. The left ventricle has no regional wall motion abnormalities. Left ventricular diastolic parameters are consistent with Grade I diastolic dysfunction (impaired relaxation).  2. Right ventricular systolic function is normal. The right ventricular size is normal. Tricuspid regurgitation signal is inadequate for assessing PA pressure.  3. The mitral valve is normal in structure. Mild mitral valve regurgitation. No evidence of mitral stenosis.  4. The aortic valve is tricuspid. Aortic valve regurgitation is not visualized. No aortic stenosis is present.  5. The inferior vena cava is normal in size with greater than 50% respiratory variability, suggesting right atrial pressure of 3 mmHg. FINDINGS  Left Ventricle: Left ventricular ejection fraction, by estimation, is 60 to 65%. The left ventricle has normal function. The left  ventricle has no regional wall motion abnormalities. Strain was performed and the global longitudinal strain is indeterminate. The left ventricular internal cavity size was normal in size. There is no left ventricular hypertrophy. Left ventricular diastolic parameters are consistent with Grade I diastolic dysfunction (impaired relaxation). Right Ventricle: The right ventricular size is normal. No increase in right ventricular wall thickness. Right ventricular systolic function is normal. Tricuspid regurgitation signal is inadequate for assessing PA pressure. Left Atrium: Left atrial size was normal in size. Right Atrium: Right atrial size was normal in size. Pericardium: There is no evidence of pericardial effusion. Mitral Valve: The mitral valve is normal in structure. Mild mitral valve regurgitation. No evidence of mitral valve stenosis. Tricuspid Valve: The tricuspid valve is normal in structure. Tricuspid valve regurgitation is not demonstrated. No evidence of tricuspid stenosis. Aortic Valve: The aortic valve is tricuspid. Aortic valve regurgitation is not visualized. No aortic stenosis is present. Pulmonic Valve: The pulmonic valve was normal in structure. Pulmonic valve regurgitation is not visualized. No evidence of pulmonic stenosis. Aorta: The aortic root is normal in size and structure. Venous: The inferior vena cava is normal in size with greater than 50% respiratory variability, suggesting right atrial pressure of 3 mmHg. IAS/Shunts: No atrial level shunt detected by color flow Doppler. Additional Comments: 3D was performed not requiring image post processing on an independent workstation and  was indeterminate.  LEFT VENTRICLE PLAX 2D LVIDd:         4.10 cm   Diastology LVIDs:         2.60 cm   LV e' medial:    6.74 cm/s LV PW:         0.90 cm   LV E/e' medial:  9.0 LV IVS:        0.80 cm   LV e' lateral:   11.70 cm/s LVOT diam:     1.80 cm   LV E/e' lateral: 5.2 LV SV:         46 LV SV Index:   28 LVOT  Area:     2.54 cm  RIGHT VENTRICLE             IVC RV Basal diam:  2.40 cm     IVC diam: 1.20 cm RV S prime:     12.00 cm/s TAPSE (M-mode): 2.1 cm LEFT ATRIUM           Index        RIGHT ATRIUM           Index LA diam:      3.00 cm 1.86 cm/m   RA Area:     10.20 cm LA Vol (A4C): 20.9 ml 12.98 ml/m  RA Volume:   20.80 ml  12.92 ml/m  AORTIC VALVE LVOT Vmax:   91.70 cm/s LVOT Vmean:  54.500 cm/s LVOT VTI:    0.180 m  AORTA Ao Root diam: 3.00 cm Ao Asc diam:  3.00 cm MITRAL VALVE MV Area (PHT): 2.95 cm    SHUNTS MV Decel Time: 257 msec    Systemic VTI:  0.18 m MV E velocity: 60.80 cm/s  Systemic Diam: 1.80 cm MV A velocity: 58.20 cm/s MV E/A ratio:  1.04 Belva Boyden MD Electronically signed by Belva Boyden MD Signature Date/Time: 06/11/2023/8:25:54 AM    Final    MR BRAIN WO CONTRAST Result Date: 06/10/2023 CLINICAL DATA:  Left-sided forehead, arm, and leg numbness. Concern for stroke. EXAM: MRI HEAD WITHOUT CONTRAST TECHNIQUE: Multiplanar, multiecho pulse sequences of the brain and surrounding structures were obtained without intravenous contrast. COMPARISON:  Head CT 06/10/2023 FINDINGS: Brain: There is no evidence of an acute infarct, intracranial hemorrhage, mass, midline shift, or extra-axial fluid collection. Cerebral volume is normal. The ventricles are normal in size. Cerebral white matter T2 hyperintensities are nonspecific but compatible with minimal chronic small vessel ischemic disease. Vascular: Major intracranial vascular flow voids are preserved. Skull and upper cervical spine: Unremarkable bone marrow signal. Sinuses/Orbits: Unremarkable orbits. Paranasal sinuses and mastoid air cells are clear. Other: None. IMPRESSION: 1. No acute intracranial abnormality. 2. Minimal chronic small vessel ischemic disease. Electronically Signed   By: Aundra Lee M.D.   On: 06/10/2023 15:02   CT HEAD CODE STROKE WO CONTRAST Result Date: 06/10/2023 CLINICAL DATA:  Code stroke. Hand numbness beginning  today. Left forehead numbness which extended into the left upper and lower extremity. EXAM: CT HEAD WITHOUT CONTRAST TECHNIQUE: Contiguous axial images were obtained from the base of the skull through the vertex without intravenous contrast. RADIATION DOSE REDUCTION: This exam was performed according to the departmental dose-optimization program which includes automated exposure control, adjustment of the mA and/or kV according to patient size and/or use of iterative reconstruction technique. COMPARISON:  None Available. FINDINGS: Brain: No acute infarct, hemorrhage, or mass lesion is present. No significant white matter lesions are present. Deep brain nuclei are within normal limits. The  ventricles are of normal size. No significant extraaxial fluid collection is present. Midline structures are within normal limits. The brainstem and cerebellum are within normal limits. Vascular: No hyperdense vessel or unexpected calcification. Skull: Calvarium is intact. No focal lytic or blastic lesions are present. No significant extracranial soft tissue lesion is present. Sinuses/Orbits: The paranasal sinuses and mastoid air cells are clear. The globes and orbits are within normal limits. ASPECTS [facility] Stroke Program Early CT Score) - Ganglionic level infarction (caudate, lentiform nuclei, internal capsule, insula, M1-M3 cortex): 7/7 - Supraganglionic infarction (M4-M6 cortex): 3/3 Total score (0-10 with 10 being normal): 10/10 IMPRESSION: 1. Normal head CT. 2. Aspects is 10/10. The above was relayed via text pager to Dr. Doretta Gant on 06/10/2023 at 08:38 . Electronically Signed   By: Audree Leas M.D.   On: 06/10/2023 08:38   MM 3D SCREENING MAMMOGRAM BILATERAL BREAST Result Date: 05/27/2023 CLINICAL DATA:  Screening. EXAM: DIGITAL SCREENING BILATERAL MAMMOGRAM WITH TOMOSYNTHESIS AND CAD TECHNIQUE: Bilateral screening digital craniocaudal and mediolateral oblique mammograms were obtained. Bilateral screening digital  breast tomosynthesis was performed. The images were evaluated with computer-aided detection. COMPARISON:  Previous exam(s). ACR Breast Density Category c: The breasts are heterogeneously dense, which may obscure small masses. FINDINGS: There are no findings suspicious for malignancy. IMPRESSION: No mammographic evidence of malignancy. A result letter of this screening mammogram will be mailed directly to the patient. RECOMMENDATION: Screening mammogram in one year. (Code:SM-B-01Y) BI-RADS CATEGORY  1: Negative. Electronically Signed   By: Sande Cromer M.D.   On: 05/27/2023 16:40    Microbiology: No results found for this or any previous visit (from the past 240 hours).   Labs: Basic Metabolic Panel: Recent Labs  Lab 06/10/23 0813  NA 138  K 4.0  CL 101  CO2 26  GLUCOSE 113*  BUN 24*  CREATININE 1.14*  CALCIUM 9.3   Liver Function Tests: Recent Labs  Lab 06/10/23 0813  AST 21  ALT 15  ALKPHOS 52  BILITOT 0.8  PROT 7.4  ALBUMIN 4.4   No results for input(s): "LIPASE", "AMYLASE" in the last 168 hours. No results for input(s): "AMMONIA" in the last 168 hours. CBC: Recent Labs  Lab 06/10/23 0813  WBC 5.4  NEUTROABS 2.9  HGB 13.3  HCT 39.2  MCV 92.5  PLT 332   Cardiac Enzymes: No results for input(s): "CKTOTAL", "CKMB", "CKMBINDEX", "TROPONINI" in the last 168 hours. BNP: BNP (last 3 results) No results for input(s): "BNP" in the last 8760 hours.  ProBNP (last 3 results) No results for input(s): "PROBNP" in the last 8760 hours.  CBG: Recent Labs  Lab 06/10/23 0812  GLUCAP 109*       Signed:  Raymonde Calico MD.  [facility] 06/12/2023, 9:33 AM

## 2023-06-13 LAB — PROLACTIN: Prolactin: 15.5 ng/mL (ref 3.6–25.2)

## 2023-06-16 DIAGNOSIS — Z1331 Encounter for screening for depression: Secondary | ICD-10-CM | POA: Diagnosis not present

## 2023-06-16 DIAGNOSIS — Z8673 Personal history of transient ischemic attack (TIA), and cerebral infarction without residual deficits: Secondary | ICD-10-CM | POA: Diagnosis not present

## 2023-06-16 DIAGNOSIS — G4733 Obstructive sleep apnea (adult) (pediatric): Secondary | ICD-10-CM | POA: Diagnosis not present

## 2023-06-21 ENCOUNTER — Encounter: Payer: Self-pay | Admitting: Internal Medicine

## 2023-06-21 ENCOUNTER — Ambulatory Visit: Admitting: Internal Medicine

## 2023-06-21 VITALS — BP 100/64 | HR 62 | Temp 97.6°F | Ht 63.0 in | Wt 128.4 lb

## 2023-06-21 DIAGNOSIS — N179 Acute kidney failure, unspecified: Secondary | ICD-10-CM | POA: Insufficient documentation

## 2023-06-21 DIAGNOSIS — Z8673 Personal history of transient ischemic attack (TIA), and cerebral infarction without residual deficits: Secondary | ICD-10-CM | POA: Diagnosis not present

## 2023-06-21 DIAGNOSIS — E039 Hypothyroidism, unspecified: Secondary | ICD-10-CM | POA: Diagnosis not present

## 2023-06-21 DIAGNOSIS — N183 Chronic kidney disease, stage 3 unspecified: Secondary | ICD-10-CM | POA: Insufficient documentation

## 2023-06-21 DIAGNOSIS — D497 Neoplasm of unspecified behavior of endocrine glands and other parts of nervous system: Secondary | ICD-10-CM

## 2023-06-21 LAB — BASIC METABOLIC PANEL WITH GFR
BUN: 21 mg/dL (ref 6–23)
CO2: 27 meq/L (ref 19–32)
Calcium: 9.6 mg/dL (ref 8.4–10.5)
Chloride: 101 meq/L (ref 96–112)
Creatinine, Ser: 1.1 mg/dL (ref 0.40–1.20)
GFR: 53.14 mL/min — ABNORMAL LOW (ref >60.00)
Glucose, Bld: 88 mg/dL (ref 70–99)
Potassium: 4.2 meq/L (ref 3.5–5.1)
Sodium: 138 meq/L (ref 135–145)

## 2023-06-21 LAB — CORTISOL: Cortisol, Plasma: 7.2 ug/dL

## 2023-06-21 NOTE — Assessment & Plan Note (Signed)
-   Patient was noted to have an elevated creatinine 1.14 on recent admission with a GFR in the 50s -I suspect this likely prerenal -Will recheck a BMP today to see if this is resolved -Of note, on review of her prior creatinines she ranges between 0.8-1 -No further workup at this time

## 2023-06-21 NOTE — Assessment & Plan Note (Signed)
-   While patient was undergoing evaluation for possible CVA she was noted to have a pituitary microadenoma on imaging -Prolactin level is within normal limits.  Patient had recent TSH was within normal limits. -Will check a cortisol level as well as IGF-I to complete the hormonal workup -Will not check FSH and LH given that she is postmenopausal -No further imaging needed per radiology -No further workup at this time

## 2023-06-21 NOTE — Assessment & Plan Note (Signed)
-   This problem is chronic and stable -Patient is on Synthroid  125 mcg daily and had a recent TSH done which was within normal limits  -No further workup at this time -Will continue with current dose of Synthroid  for now

## 2023-06-21 NOTE — Assessment & Plan Note (Signed)
-   Patient was recently admitted to the hospital with an acute CVA -Workup including MRI, 2D echo, CTA head neck, CT head were unrevealing.  Patient was on a cardiac monitor while inpatient without any arrhythmias noted -On exam, patient had intact strength but decreased sensation noted over the left side of her face and left upper and lower extremities. -I suspect given the persistence of her symptoms that she likely had an MRI negative CVA -She did follow-up with neurology last week who agreed with the MRI negative CVA diagnosis -Will continue with aspirin  and statin for now -Patient blood pressure well-controlled with systolics in the 100's (blood pressure today was 100/64) off medication -Patient will follow-up with cardiology per neurology recommendations (possibly for long-term cardiac monitoring) -No further workup at this time

## 2023-06-21 NOTE — Progress Notes (Signed)
 Acute Office Visit  Subjective:     Patient ID: Kathleen Sims, female    DOB: [birth year], 63 y.o.   MRN: [id number]  Chief Complaint  Patient presents with   Hospitalization Follow-up    HPI Patient is in today for hospital follow-up for an acute CVA.  Patient states that she developed acute left-sided tingling and numbness and was referred to the ED by the nurse at her workplace.  She was admitted to the hospital for possible CVA and had an MRI of her head and neck, CTA head and neck, echo and cardiac monitoring which was unrevealing.  She was discovered to have an incidental pituitary microadenoma as well during this hospitalization.  Patient was started on aspirin  and a statin and discharged a little over a week ago.  Patient still complains of intermittent tingling and numbness over the left side of her body especially over the left side of her face.  Denies any other complaints at this time.  Medications and treatment plan were reviewed in detail with the patient this  Review of Systems  Constitutional: Negative.  Negative for fever.  HENT: Negative.    Respiratory: Negative.    Cardiovascular: Negative.   Gastrointestinal: Negative.   Musculoskeletal: Negative.   Neurological:  Positive for tingling and sensory change. Negative for speech change, focal weakness, weakness and headaches.  Psychiatric/Behavioral: Negative.          Objective:    BP 100/64   Pulse 62   Temp 97.6 F (36.4 C)   Ht 5\' 3"  (1.6 m)   Wt 128 lb 6.4 oz (58.2 kg)   SpO2 95%   BMI 22.75 kg/m    Physical Exam Constitutional:      Appearance: Normal appearance.  HENT:     Head: Normocephalic and atraumatic.  Cardiovascular:     Rate and Rhythm: Normal rate and regular rhythm.     Heart sounds: Normal heart sounds.  Pulmonary:     Effort: No respiratory distress.     Breath sounds: Normal breath sounds. No wheezing.  Neurological:     Mental Status: She is alert and oriented to person,  place, and time.     Sensory: Sensory deficit present.     Motor: No weakness.     Comments: Patient noted to have power 5 out of 5 in bilateral upper and lower extremities.  SCM strength intact.  Tongue central on protrusion.  EOM intact.  Finger-nose test normal.  Decreased sensation noted over the left side of her body including the left forehead over the left upper extremity and lower extremity compared to the left side  Psychiatric:        Mood and Affect: Mood normal.        Behavior: Behavior normal.     No results found for any visits on 06/21/23.      Assessment & Plan:   Problem List Items Addressed This Visit       Endocrine   Hypothyroid   - This problem is chronic and stable -Patient is on Synthroid  125 mcg daily and had a recent TSH done which was within normal limits  -No further workup at this time -Will continue with current dose of Synthroid  for now      Pituitary incidentaloma - Primary   - While patient was undergoing evaluation for possible CVA she was noted to have a pituitary microadenoma on imaging -Prolactin level is within normal limits.  Patient had recent TSH  was within normal limits. -Will check a cortisol level as well as IGF-I to complete the hormonal workup -Will not check FSH and LH given that she is postmenopausal -No further imaging needed per radiology -No further workup at this time      Relevant Orders   Cortisol   Insulin-like growth factor     Genitourinary   AKI (acute kidney injury) (HCC)   - Patient was noted to have an elevated creatinine 1.14 on recent admission with a GFR in the 50s -I suspect this likely prerenal -Will recheck a BMP today to see if this is resolved -Of note, on review of her prior creatinines she ranges between 0.8-1 -No further workup at this time      Relevant Orders   Basic metabolic panel with GFR     Other   History of CVA (cerebrovascular accident)   - Patient was recently admitted to the  hospital with an acute CVA -Workup including MRI, 2D echo, CTA head neck, CT head were unrevealing.  Patient was on a cardiac monitor while inpatient without any arrhythmias noted -On exam, patient had intact strength but decreased sensation noted over the left side of her face and left upper and lower extremities. -I suspect given the persistence of her symptoms that she likely had an MRI negative CVA -She did follow-up with neurology last week who agreed with the MRI negative CVA diagnosis -Will continue with aspirin  and statin for now -Patient blood pressure well-controlled with systolics in the 100's (blood pressure today was 100/64) off medication -Patient will follow-up with cardiology per neurology recommendations (possibly for long-term cardiac monitoring) -No further workup at this time       No orders of the defined types were placed in this encounter.   No follow-ups on file.  Trust Crago, MD

## 2023-06-21 NOTE — Patient Instructions (Addendum)
-   It was a pleasure meeting you today -Your prolactin and thyroid  hormone levels have been normal recently.  No further imaging was required for your pituitary adenoma but you do need some blood work to check other hormone levels including cortisol and insulin like growth factor.  We will check those today - Please follow-up with your neurologist and your cardiologist as needed -Your kidney function is slightly abnormal when you were admitted to the hospital.  Will recheck this for you again today -Please contact us  with any questions or concerns

## 2023-06-22 ENCOUNTER — Ambulatory Visit: Payer: Self-pay | Admitting: Internal Medicine

## 2023-06-24 ENCOUNTER — Encounter: Payer: Self-pay | Admitting: Internal Medicine

## 2023-06-25 ENCOUNTER — Telehealth: Payer: Self-pay | Admitting: Internal Medicine

## 2023-06-25 DIAGNOSIS — N1831 Chronic kidney disease, stage 3a: Secondary | ICD-10-CM

## 2023-06-25 NOTE — Assessment & Plan Note (Signed)
-   Patient noted to have a low GFR in the 50s with a creatinine 1.1 -On review of her prior labs she has had a creatinine between 1-1.1 from 2019 -She has had previous GFR's in the 50s as well -Would check a protein creatinine ratio as well as a UA (last done in 2019) -If negative would consider further imaging but her last CT abdomen/pelvis showed no evidence of hydronephrosis and no perinephric edema -Results were discussed with the patient and she agrees with the plan.  All questions answered.

## 2023-06-25 NOTE — Telephone Encounter (Signed)
 I called this patient to discuss the results of her BMP.  Patient was noted to have elevated creatinine 1.1 as well as a GFR in the 50s.  Looking back on her prior results her creatinine has remained stable since 2019 (creatinine ranging from 1-1.1).  She has had prior GFR's in the 50s as well.  I suspect that she likely has CKD stage IIIa.  Etiology behind this remains unclear.  She will need further workup including a UA and urine protein to creatinine ratio and possibly further imaging.  Her last CT abdomen/pelvis in 2023 showed no evidence of hydronephrosis with no perinephric edema.  Her last UA in 2019 showed no protein.  No further workup required at this time.

## 2023-06-25 NOTE — Telephone Encounter (Signed)
 Noted

## 2023-06-29 ENCOUNTER — Other Ambulatory Visit (INDEPENDENT_AMBULATORY_CARE_PROVIDER_SITE_OTHER)

## 2023-06-29 DIAGNOSIS — N1831 Chronic kidney disease, stage 3a: Secondary | ICD-10-CM

## 2023-06-29 DIAGNOSIS — D497 Neoplasm of unspecified behavior of endocrine glands and other parts of nervous system: Secondary | ICD-10-CM

## 2023-06-29 NOTE — Addendum Note (Signed)
 Addended by: Thressa Flora D on: 06/29/2023 08:43 AM   Modules accepted: Orders

## 2023-06-30 ENCOUNTER — Ambulatory Visit: Payer: Self-pay | Admitting: Internal Medicine

## 2023-06-30 LAB — URINALYSIS, ROUTINE W REFLEX MICROSCOPIC
Bilirubin Urine: NEGATIVE
Glucose, UA: NEGATIVE
Hgb urine dipstick: NEGATIVE
Ketones, ur: NEGATIVE
Leukocytes,Ua: NEGATIVE
Nitrite: NEGATIVE
Protein, ur: NEGATIVE
Specific Gravity, Urine: 1.006 (ref 1.001–1.035)
pH: 6.5 (ref 5.0–8.0)

## 2023-06-30 LAB — PROTEIN / CREATININE RATIO, URINE
Creatinine, Urine: 23 mg/dL (ref 20–275)
Total Protein, Urine: <4 mg/dL — ABNORMAL LOW (ref 5–24)

## 2023-07-02 ENCOUNTER — Ambulatory Visit

## 2023-07-02 DIAGNOSIS — R0602 Shortness of breath: Secondary | ICD-10-CM | POA: Diagnosis not present

## 2023-07-02 DIAGNOSIS — E782 Mixed hyperlipidemia: Secondary | ICD-10-CM | POA: Diagnosis not present

## 2023-07-02 DIAGNOSIS — R0789 Other chest pain: Secondary | ICD-10-CM | POA: Diagnosis not present

## 2023-07-04 LAB — INSULIN-LIKE GROWTH FACTOR
IGF-I, LC/MS: 100 ng/mL (ref 41–279)
Z-Score (Female): -0.4 {STDV} (ref ?–2.0)

## 2023-07-08 DIAGNOSIS — G4730 Sleep apnea, unspecified: Secondary | ICD-10-CM | POA: Diagnosis not present

## 2023-07-20 DIAGNOSIS — R0602 Shortness of breath: Secondary | ICD-10-CM | POA: Diagnosis not present

## 2023-07-20 DIAGNOSIS — R0789 Other chest pain: Secondary | ICD-10-CM | POA: Diagnosis not present

## 2023-07-22 DIAGNOSIS — H524 Presbyopia: Secondary | ICD-10-CM | POA: Diagnosis not present

## 2023-07-22 DIAGNOSIS — H52223 Regular astigmatism, bilateral: Secondary | ICD-10-CM | POA: Diagnosis not present

## 2023-07-22 DIAGNOSIS — D352 Benign neoplasm of pituitary gland: Secondary | ICD-10-CM | POA: Diagnosis not present

## 2023-07-22 DIAGNOSIS — H5203 Hypermetropia, bilateral: Secondary | ICD-10-CM | POA: Diagnosis not present

## 2023-08-06 ENCOUNTER — Ambulatory Visit

## 2023-08-09 ENCOUNTER — Other Ambulatory Visit: Payer: Self-pay | Admitting: Nephrology

## 2023-08-09 DIAGNOSIS — N1831 Chronic kidney disease, stage 3a: Secondary | ICD-10-CM | POA: Diagnosis not present

## 2023-08-12 DIAGNOSIS — G4733 Obstructive sleep apnea (adult) (pediatric): Secondary | ICD-10-CM | POA: Diagnosis not present

## 2023-08-13 DIAGNOSIS — D2261 Melanocytic nevi of right upper limb, including shoulder: Secondary | ICD-10-CM | POA: Diagnosis not present

## 2023-08-13 DIAGNOSIS — D2272 Melanocytic nevi of left lower limb, including hip: Secondary | ICD-10-CM | POA: Diagnosis not present

## 2023-08-13 DIAGNOSIS — D2262 Melanocytic nevi of left upper limb, including shoulder: Secondary | ICD-10-CM | POA: Diagnosis not present

## 2023-08-13 DIAGNOSIS — D225 Melanocytic nevi of trunk: Secondary | ICD-10-CM | POA: Diagnosis not present

## 2023-08-17 ENCOUNTER — Ambulatory Visit
Admission: RE | Admit: 2023-08-17 | Discharge: 2023-08-17 | Disposition: A | Discharge status: Home / Self Care | Source: Ambulatory Visit | Attending: Nephrology | Admitting: Nephrology

## 2023-08-17 DIAGNOSIS — N1831 Chronic kidney disease, stage 3a: Secondary | ICD-10-CM | POA: Insufficient documentation

## 2023-08-17 DIAGNOSIS — N281 Cyst of kidney, acquired: Secondary | ICD-10-CM | POA: Diagnosis not present

## 2023-09-07 DIAGNOSIS — R944 Abnormal results of kidney function studies: Secondary | ICD-10-CM | POA: Diagnosis not present

## 2023-09-12 DIAGNOSIS — G4733 Obstructive sleep apnea (adult) (pediatric): Secondary | ICD-10-CM | POA: Diagnosis not present

## 2023-09-17 ENCOUNTER — Ambulatory Visit (INDEPENDENT_AMBULATORY_CARE_PROVIDER_SITE_OTHER)

## 2023-09-17 DIAGNOSIS — Z23 Encounter for immunization: Secondary | ICD-10-CM

## 2023-09-17 NOTE — Progress Notes (Signed)
 Pt received 2nd Shingles injection in right  deltoid. Pt tolerated it well with no complaints or concerns.

## 2023-10-04 DIAGNOSIS — M79671 Pain in right foot: Secondary | ICD-10-CM | POA: Diagnosis not present

## 2023-10-04 DIAGNOSIS — M898X7 Other specified disorders of bone, ankle and foot: Secondary | ICD-10-CM | POA: Diagnosis not present

## 2023-10-04 DIAGNOSIS — M2021 Hallux rigidus, right foot: Secondary | ICD-10-CM | POA: Diagnosis not present

## 2023-10-28 DIAGNOSIS — I6930 Unspecified sequelae of cerebral infarction: Secondary | ICD-10-CM | POA: Diagnosis not present

## 2023-10-28 DIAGNOSIS — G4733 Obstructive sleep apnea (adult) (pediatric): Secondary | ICD-10-CM | POA: Diagnosis not present

## 2023-11-18 DIAGNOSIS — M2021 Hallux rigidus, right foot: Secondary | ICD-10-CM | POA: Diagnosis not present

## 2023-11-18 DIAGNOSIS — Z8673 Personal history of transient ischemic attack (TIA), and cerebral infarction without residual deficits: Secondary | ICD-10-CM | POA: Diagnosis not present

## 2023-11-19 ENCOUNTER — Other Ambulatory Visit: Payer: Self-pay | Admitting: Podiatry

## 2023-11-22 ENCOUNTER — Other Ambulatory Visit: Payer: Self-pay | Admitting: Podiatry

## 2023-11-26 ENCOUNTER — Ambulatory Visit

## 2023-11-26 VITALS — BP 110/72 | HR 66 | Temp 98.4°F | Ht 63.0 in | Wt 131.8 lb

## 2023-11-26 DIAGNOSIS — M19071 Primary osteoarthritis, right ankle and foot: Secondary | ICD-10-CM

## 2023-11-26 DIAGNOSIS — E782 Mixed hyperlipidemia: Secondary | ICD-10-CM

## 2023-11-26 DIAGNOSIS — G4733 Obstructive sleep apnea (adult) (pediatric): Secondary | ICD-10-CM | POA: Insufficient documentation

## 2023-11-26 DIAGNOSIS — M8580 Other specified disorders of bone density and structure, unspecified site: Secondary | ICD-10-CM

## 2023-11-26 DIAGNOSIS — Z8673 Personal history of transient ischemic attack (TIA), and cerebral infarction without residual deficits: Secondary | ICD-10-CM

## 2023-11-26 DIAGNOSIS — E039 Hypothyroidism, unspecified: Secondary | ICD-10-CM

## 2023-11-26 DIAGNOSIS — K581 Irritable bowel syndrome with constipation: Secondary | ICD-10-CM

## 2023-11-26 MED ORDER — ASPIRIN 81 MG PO CHEW: 81.0000 mg | CHEWABLE_TABLET | Freq: Every day | ORAL | 3 refills | Status: AC

## 2023-11-26 MED ORDER — ATORVASTATIN CALCIUM 40 MG PO TABS: 40.0000 mg | ORAL_TABLET | Freq: Every day | ORAL | 3 refills | Status: AC

## 2023-11-26 MED ORDER — LEVOTHYROXINE SODIUM 125 MCG PO TABS: 125.0000 ug | ORAL_TABLET | Freq: Every day | ORAL | 3 refills | Status: AC

## 2023-11-26 NOTE — Assessment & Plan Note (Addendum)
 Recent diagnosis (06/2023), previously seen by Dr. Maree (neurologist at [facility] clinic). She is not established with a sleep specialist. Referral to Dr. Jess for OSA management made today. Compliant with CPAP, encourage patient to continue use.  Orders:   Ambulatory referral to Pulmonology

## 2023-11-26 NOTE — Assessment & Plan Note (Addendum)
 Previous lab showing elevated LDL, HDL.  On Atorvastatin  40 mg daily, tolerating.  Check fasting lipid panel, CMP. If LDL elevated over 100 consider increasing Atorvastatin  to 80 mg daily otherwise continue current treatment.  Orders:   Lipid panel; Future   Comp Met (CMET); Future

## 2023-11-26 NOTE — Patient Instructions (Signed)
Start taking vitamin D 1000 units daily.

## 2023-11-26 NOTE — Assessment & Plan Note (Addendum)
 Diagnosis 05/2023 with some residual left-sided numbness, improving.  On aspirin  81 mg and atorvastatin  40 mg daily, continue.  Educated on recognizing stroke symptoms and importance of immediate ED visit if symptoms recur. Repeat fasting lipid panel, CMP.  Recommend updating COVID-19 and pneumonia immunization through local pharmacy.   Orders:   Lipid panel; Future   Comp Met (CMET); Future

## 2023-11-26 NOTE — Assessment & Plan Note (Deleted)
 Kathleen Sims

## 2023-11-26 NOTE — Assessment & Plan Note (Addendum)
 Noted on DEXA on 09/12/21.  Recommend incorporating vitamin D  1000 units daily to reduce progression into osteoporosis. On daily calcium  supplement, continue. Also recommend continuing healthy lifestyle practices, regular exercise.

## 2023-11-26 NOTE — Assessment & Plan Note (Addendum)
 Right foot arthritis with planned surgery on November 19th with podiatrist Eva Gay at [place].

## 2023-11-26 NOTE — Assessment & Plan Note (Addendum)
 Chronic. Last TSH normal:1.91 on 05/24/23.  Continue Levothyroxine  125 mcg, on an empty stomach, daily.  Repeat TSH.  Orders:   levothyroxine  (SYNTHROID ) 125 MCG tablet; Take 1 tablet (125 mcg total) by mouth daily before breakfast.   TSH; Future

## 2023-11-26 NOTE — Assessment & Plan Note (Addendum)
 Chronic, with constipation predominant symptoms. Managed by [facility]. No new symptoms, continue f/u with GI for management.

## 2023-11-26 NOTE — Progress Notes (Signed)
 Established Patient Office Visit TOC from Dr. Hope    Subjective  Patient ID: Kathleen Sims, female    DOB: [birth year]  Age: 63 y.o. MRN: [id number]  Chief Complaint  Patient presents with   Establish Care    Discussed the use of AI scribe software for clinical note transcription with the patient, who gave verbal consent to proceed.  History of Present Illness Kathleen Sims is a 63 year old female with a history of acquired hypothyroidism presents for transfer of care from previous PCP.   - H/O CVA in 05/2023 with residual left facial numbness, improving. Mixed hyperlipidemia. Has seen Dr. Land at [facility]. Currently on Atorvastatin  40 mg daily and Aspirin  81 mg daily.  Previously treated with HRT for menopausal symptoms which she stopped when COVID-19 started.  - OSA: Diagnosed in 06/2023 and is on CPAP. She is not established with a sleep specialist yet.  - R foot OA: Seeing Dr. Ashley at [facility] with plan of foot surgery on December 08, 2023.  - Acquired hypothyroidism: She has a history of hypothyroidism diagnosed around age 84, managed with levothyroxine  125 mcg daily.  - IBS/constipation predominant: She experiences constipation, which she manages with daily Miralax and occasional stool softeners. She sees GI at [facility] clinic for this. She no longer uses antispasmodic medication but finds relief with topical application of Preparation H during severe spasms. - Osteopenia: Diagnosed in previous DEXA in 09/12/2021. Currently not taking vitamin D  supplement. -  She maintains an active lifestyle, engaging in strength training and cardiovascular exercise. She follows a healthy diet, avoiding fast food and limiting sweets.  - She has received her flu and shingles vaccines and plans to get the COVID vaccine. She experiences significant side effects from vaccines, including fever and malaise. - Has a h/o mildly low GFR, was evaluated by nephrologist Dr. Marcelino,  recommendation no further evaluation. Does not take NSAIDs.       ROS As per HPI    Objective:     BP 110/72 (BP Location: Right Arm, Patient Position: Sitting, Cuff Size: Normal)   Pulse 66   Temp 98.4 F (36.9 C) (Oral)   Ht 5' 3 (1.6 m)   Wt 131 lb 12.8 oz (59.8 kg)   SpO2 95%   BMI 23.35 kg/m      11/26/2023   11:08 AM 06/21/2023   11:01 AM 05/31/2023    8:20 AM  Depression screen PHQ 2/9  Decreased Interest 0 0 0  Down, Depressed, Hopeless 0 0 0  PHQ - 2 Score 0 0 0  Altered sleeping 0 0 0  Tired, decreased energy 0 0 0  Change in appetite 0 0 0  Feeling bad or failure about yourself  0 0 0  Trouble concentrating 0 0 0  Moving slowly or fidgety/restless 0 0 0  Suicidal thoughts 0 0 0  PHQ-9 Score 0 0  0   Difficult doing work/chores Not difficult at all Not difficult at all Not difficult at all     Data saved with a previous flowsheet row definition      11/26/2023   11:08 AM 06/21/2023   11:01 AM 05/31/2023    8:20 AM 05/31/2023    8:17 AM  GAD 7 : Generalized Anxiety Score  Nervous, Anxious, on Edge 0 0 0 0  Control/stop worrying 0 0 0 0  Worry too much - different things 0 0 0 0  Trouble relaxing 0 0 0 0  Restless 0 0 0 0  Easily annoyed or irritable 0 0 0 0  Afraid - awful might happen 0 0 0 0  Total GAD 7 Score 0 0 0 0  Anxiety Difficulty Not difficult at all Not difficult at all Not difficult at all Not difficult at all      11/26/2023   11:08 AM 06/21/2023   11:01 AM 05/31/2023    8:20 AM  Depression screen PHQ 2/9  Decreased Interest 0 0 0  Down, Depressed, Hopeless 0 0 0  PHQ - 2 Score 0 0 0  Altered sleeping 0 0 0  Tired, decreased energy 0 0 0  Change in appetite 0 0 0  Feeling bad or failure about yourself  0 0 0  Trouble concentrating 0 0 0  Moving slowly or fidgety/restless 0 0 0  Suicidal thoughts 0 0 0  PHQ-9 Score 0 0  0   Difficult doing work/chores Not difficult at all Not difficult at all Not difficult at all     Data saved  with a previous flowsheet row definition      11/26/2023   11:08 AM 06/21/2023   11:01 AM 05/31/2023    8:20 AM 05/31/2023    8:17 AM  GAD 7 : Generalized Anxiety Score  Nervous, Anxious, on Edge 0 0 0 0  Control/stop worrying 0 0 0 0  Worry too much - different things 0 0 0 0  Trouble relaxing 0 0 0 0  Restless 0 0 0 0  Easily annoyed or irritable 0 0 0 0  Afraid - awful might happen 0 0 0 0  Total GAD 7 Score 0 0 0 0  Anxiety Difficulty Not difficult at all Not difficult at all Not difficult at all Not difficult at all   SDOH Screenings   Food Insecurity: No Food Insecurity (11/24/2023)  Housing: Unknown (11/24/2023)  Transportation Needs: No Transportation Needs (11/24/2023)  Utilities: Not At Risk (07/02/2023)   Received from [facility]  Alcohol Screen: Low Risk  (03/01/2023)  Depression (PHQ2-9): Low Risk  (11/26/2023)  Financial Resource Strain: Low Risk  (11/24/2023)  Physical Activity: Sufficiently Active (11/24/2023)  Social Connections: Unknown (11/24/2023)  Stress: No Stress Concern Present (11/24/2023)  Tobacco Use: Low Risk  (11/26/2023)     Physical Exam Constitutional:      Appearance: Normal appearance.  HENT:     Head: Normocephalic and atraumatic.     Right Ear: Tympanic membrane normal. There is no impacted cerumen.     Left Ear: Tympanic membrane normal. There is no impacted cerumen.     Mouth/Throat:     Mouth: Mucous membranes are moist.  Neck:     Thyroid : No thyroid  mass or thyroid  tenderness.  Cardiovascular:     Rate and Rhythm: Normal rate and regular rhythm.  Pulmonary:     Effort: Pulmonary effort is normal.     Breath sounds: Normal breath sounds. No wheezing.  Abdominal:     General: Bowel sounds are normal.     Palpations: Abdomen is soft.     Tenderness: There is no guarding.  Musculoskeletal:     Cervical back: Neck supple. No rigidity or tenderness.     Right lower leg: No edema.     Left lower leg: No edema.  Skin:     General: Skin is warm.  Neurological:     Mental Status: She is alert and oriented to person, place, and time.  Cranial Nerves: No facial asymmetry.     Motor: No weakness.     Gait: Gait is intact.  Psychiatric:        Mood and Affect: Mood normal.        Behavior: Behavior normal.        No results found for any visits on 11/26/23.  The ASCVD Risk score (Arnett DK, et al., 2019) failed to calculate for the following reasons:   Risk score cannot be calculated because patient has a medical history suggesting prior/existing ASCVD     Assessment & Plan:   Assessment & Plan Acquired hypothyroidism Chronic. Last TSH normal:1.91 on 05/24/23.  Continue Levothyroxine  125 mcg, on an empty stomach, daily.  Repeat TSH.  Orders:   levothyroxine  (SYNTHROID ) 125 MCG tablet; Take 1 tablet (125 mcg total) by mouth daily before breakfast.   TSH; Future  OSA (obstructive sleep apnea) Recent diagnosis (06/2023), previously seen by Dr. Maree (neurologist at [facility]). She is not established with a sleep specialist. Referral to Dr. Jess for OSA management made today. Compliant with CPAP, encourage patient to continue use.  Orders:   Ambulatory referral to Pulmonology  History of CVA (cerebrovascular accident) Diagnosis 05/2023 with some residual left-sided numbness, improving.  On aspirin  81 mg and atorvastatin  40 mg daily, continue.  Educated on recognizing stroke symptoms and importance of immediate ED visit if symptoms recur. Repeat fasting lipid panel, CMP.  Recommend updating COVID-19 and pneumonia immunization through local pharmacy.   Orders:   Lipid panel; Future   Comp Met (CMET); Future  Mixed hyperlipidemia Previous lab showing elevated LDL, HDL.  On Atorvastatin  40 mg daily, tolerating.  Check fasting lipid panel, CMP. If LDL elevated over 100 consider increasing Atorvastatin  to 80 mg daily otherwise continue current treatment.  Orders:   Lipid panel; Future    Comp Met (CMET); Future  Osteopenia, unspecified location Noted on DEXA on 09/12/21.  Recommend incorporating vitamin D  1000 units daily to reduce progression into osteoporosis. On daily calcium  supplement, continue. Also recommend continuing healthy lifestyle practices, regular exercise.     Irritable bowel syndrome with constipation Chronic, with constipation predominant symptoms. Managed by [facility]. No new symptoms, continue f/u with GI for management.     Arthritis of right foot Right foot arthritis with planned surgery on November 19th with podiatrist Eva Gay at [place].      I personally spent a total of 40 minutes in the care of the patient today including preparing to see the patient, performing a medically appropriate exam/evaluation, placing orders, referring and communicating with other health care professionals, documenting clinical information in the EHR, independently interpreting results, and communicating results.    Return in about 6 months (around 05/25/2024) for 6 months for chronic and fasting labs at patient's convinience .   Luke Shade, MD

## 2023-11-29 ENCOUNTER — Ambulatory Visit: Payer: Self-pay

## 2023-11-29 ENCOUNTER — Other Ambulatory Visit

## 2023-11-29 DIAGNOSIS — E039 Hypothyroidism, unspecified: Secondary | ICD-10-CM | POA: Diagnosis not present

## 2023-11-29 DIAGNOSIS — Z8673 Personal history of transient ischemic attack (TIA), and cerebral infarction without residual deficits: Secondary | ICD-10-CM

## 2023-11-29 DIAGNOSIS — E782 Mixed hyperlipidemia: Secondary | ICD-10-CM

## 2023-11-29 LAB — COMPREHENSIVE METABOLIC PANEL WITH GFR
ALT: 18 U/L (ref 0–35)
AST: 21 U/L (ref 0–37)
Albumin: 4.5 g/dL (ref 3.5–5.2)
Alkaline Phosphatase: 66 U/L (ref 39–117)
BUN: 25 mg/dL — ABNORMAL HIGH (ref 6–23)
CO2: 29 meq/L (ref 19–32)
Calcium: 9.2 mg/dL (ref 8.4–10.5)
Chloride: 102 meq/L (ref 96–112)
Creatinine, Ser: 1.03 mg/dL (ref 0.40–1.20)
GFR: 57.33 mL/min — ABNORMAL LOW (ref >60.00)
Glucose, Bld: 93 mg/dL (ref 70–99)
Potassium: 4.5 meq/L (ref 3.5–5.1)
Sodium: 138 meq/L (ref 135–145)
Total Bilirubin: 0.6 mg/dL (ref 0.2–1.2)
Total Protein: 6.7 g/dL (ref 6.0–8.3)

## 2023-11-29 LAB — LIPID PANEL
Cholesterol: 134 mg/dL (ref 0–200)
HDL: 56.4 mg/dL (ref >39.00)
LDL Cholesterol: 66 mg/dL (ref 0–99)
NonHDL: 77.32
Total CHOL/HDL Ratio: 2
Triglycerides: 56 mg/dL (ref 0.0–149.0)
VLDL: 11.2 mg/dL (ref 0.0–40.0)

## 2023-11-29 LAB — TSH: TSH: 1 u[IU]/mL (ref 0.35–5.50)

## 2023-11-30 ENCOUNTER — Ambulatory Visit: Payer: Self-pay

## 2023-11-30 NOTE — Telephone Encounter (Signed)
 Spoke with patient to to make her aware of Dr Graylon recommendations. Patient verbalized that her blood pressure is better and she is feeling a better. Patient states she will watch. Patient advised if her symptoms worsen to go be evaluated by local ED. Verbalized understanding.

## 2023-11-30 NOTE — Telephone Encounter (Signed)
 FYI Only or Action Required?: FYI only for provider: Advised ED for worsening symptoms.  Patient was last seen in primary care on 11/26/2023 by Abbey Bruckner, MD.  Called Nurse Triage reporting Numbness.  Symptoms began several months ago.  Interventions attempted: Nothing.  Symptoms are: gradually worsening.  Triage Disposition: See PCP When Office is Open (Within 3 Days)  Patient/caregiver understands and will follow disposition?: Unsure      Copied from CRM [id number]. Topic: Clinical - Red Word Triage >> Nov 30, 2023 10:44 AM Drema MATSU wrote: Red Word that prompted transfer to Nurse Triage: Patient is experiencing more numbness and tingling in left arm and leg, blood pressure was 124/84 earlier today and is 135/78 now. Reason for Disposition  [1] Weakness of arm / hand, or leg / foot AND [2] is a chronic symptom (recurrent or ongoing AND present > 4 weeks)  Answer Assessment - Initial Assessment Questions 1. SYMPTOM: What is the main symptom you are concerned about? (e.g., weakness, numbness)     Numbness  2. ONSET: When did this start? (e.g., minutes, hours, days; while sleeping)     On going since May  3. LAST NORMAL: When was the last time you (the patient) were normal (no symptoms)?     Before May  4. PATTERN Does this come and go, or has it been constant since it started?  Is it present now?     Constant  5. CARDIAC SYMPTOMS: Have you had any of the following symptoms: chest pain, difficulty breathing, palpitations?     Denies  6. NEUROLOGIC SYMPTOMS: Have you had any of the following symptoms: headache, dizziness, vision loss, double vision, changes in speech, unsteady on your feet?     Denies  7. OTHER SYMPTOMS: Do you have any other symptoms?     Denies   Hx of stroke in May. Symptoms are worsening now. Pt states her today was  128/84 and 135/78. Concerned about symptoms due to hx. Pt states her strength on both sides of her body are the same. Pt  advised to go to ED for worsening symptoms. Refused visit in office and states she saw her provider on Friday.  Protocols used: Neurologic Deficit-A-AH

## 2023-11-30 NOTE — Telephone Encounter (Signed)
 If weakness is worse from baseline recommend ED evaluation. Defer to note form 11/26/23 for chronic/residual left sided numbness.  Luke Shade, MD

## 2023-12-06 ENCOUNTER — Encounter: Payer: Self-pay | Admitting: Podiatry

## 2023-12-06 NOTE — Anesthesia Preprocedure Evaluation (Addendum)
 Anesthesia Evaluation  Patient identified by MRN, date of birth, ID band Patient awake    Reviewed: Allergy & Precautions, H&P , NPO status , Patient's Chart, lab work & pertinent test results  Airway Mallampati: I  TM Distance: >3 FB     Dental   Pulmonary neg pulmonary ROS, sleep apnea           Cardiovascular negative cardio ROS   ECHO 06/10/2023 - 1. Left ventricular ejection fraction, by estimation, is 60 to 65%. The left ventricle has normal function. The left ventricle has no regional wall motion abnormalities. Left ventricular diastolic parameters are consistent with Grade I diastolic dysfunction (impaired relaxation). 2. Right ventricular systolic function is normal. The right ventricular size is normal. Tricuspid regurgitation signal is inadequate for assessing PA pressure.  3. The mitral valve is normal in structure. Mild mitral valve regurgitation. No evidence of mitral stenosis. 4. The aortic valve is tricuspid. Aortic valve regurgitation is not visualized. No aortic stenosis is present. 5. The inferior vena cava is normal in size with greater than 50% respiratory variability, suggesting right atrial pressure of 3 mmHg     Neuro/Psych  Headaches Occasional tingling left side of body S/P stroke  HA when working out  MRI Brain 06/11/2023 - 5 mm hypoenhancing focus within the pituitary gland suspicious  for possible pituitary microadenoma.   MRI C-Spine 06/11/2023 - 1. No lesion is identified within the cervical spinal cord. 2. Cervical spondylosis as outlined within the body of the report. 3. At C5-C6, there is moderate disc degeneration (with mild degenerative endplate edema). Posterior disc osteophyte complex with right-sided disc osteophyte ridge/uncinate hypertrophy. The  posterior disc osteophyte complex effaces the ventral thecal sac and slightly flattens the ventral aspect of the spinal cord. However, the dorsal CSF space is  maintained within the spinal canal. Moderate-to-severe right neural foraminal narrowing. Correlate for right C6 radiculopathy. 4. No significant spinal canal stenosis at the remaining levels. 5. No more than mild foraminal stenosis at the remaining levels. 6. Facet ankylosis on the right at C3-C4, and on the left at C4-C5. 7. Levocurvature of the cervical spine.   CT Head 06/10/2023 - 1. Normal head CT. 2. Aspects is 10/10.   MRI brain 06/10/2023 - 1. No acute intracranial abnormality. 2. Minimal chronic small vessel ischemic disease.   CT Angio of head and neck 06/11/2023 - No large vessel occlusion or proximal hemodynamically significant stenosis      Neuromuscular disease CVA negative neurological ROS  negative psych ROS   GI/Hepatic negative GI ROS, Neg liver ROS,,,  Endo/Other  negative endocrine ROSHypothyroidism    Renal/GU Renal diseasenegative Renal ROS  negative genitourinary   Musculoskeletal negative musculoskeletal ROS (+) Arthritis ,    Abdominal   Peds negative pediatric ROS (+)  Hematology negative hematology ROS (+)   Anesthesia Other Findings Kidney stones  Thyroid  disease Hypothyroid  Diverticulitis Arthritis  IBS (irritable bowel syndrome) Nephrolithiasis  History of nephrolithiasis History of hydronephrosis  History of diverticulitis Neck arthritis  Hydronephrosis Ureteric stone  Encounter for immunization Stroke [facility])  Sleep apnea History of kidney stones Neuromuscular disorder (HCC) Headache      Reproductive/Obstetrics negative OB ROS                              Anesthesia Physical Anesthesia Plan  ASA: 3  Anesthesia Plan:    Post-op Pain Management:    Induction:   PONV Risk  Score and Plan:   Airway Management Planned:   Additional Equipment:   Intra-op Plan:   Post-operative Plan:   Informed Consent:   Plan Discussed with:   Anesthesia Plan Comments:          Anesthesia Quick  Evaluation

## 2023-12-07 ENCOUNTER — Encounter: Payer: Self-pay | Admitting: Sleep Medicine

## 2023-12-07 ENCOUNTER — Ambulatory Visit: Admitting: Sleep Medicine

## 2023-12-07 VITALS — BP 110/70 | HR 60 | Temp 97.7°F | Ht 63.0 in | Wt 130.8 lb

## 2023-12-07 DIAGNOSIS — G4733 Obstructive sleep apnea (adult) (pediatric): Secondary | ICD-10-CM

## 2023-12-07 DIAGNOSIS — Z8673 Personal history of transient ischemic attack (TIA), and cerebral infarction without residual deficits: Secondary | ICD-10-CM

## 2023-12-07 NOTE — Patient Instructions (Signed)

## 2023-12-07 NOTE — Progress Notes (Signed)
 Name:Kathleen Sims MRN: [id number] DOB: 1960/09/25   CHIEF COMPLAINT:  EXCESSIVE DAYTIME SLEEPINESS   HISTORY OF PRESENT ILLNESS: Ms. Benedetti is a 63 y.o. w/ a h/o OSA, CVA, hypothyroidism and hyperlipidemia who presents to establish care for OSA. Reports that she was initially diagnosed with moderate OSA a few months ago and was subsequently started on CPAP therapy. Reports using CPAP therapy every night, which is confirmed by compliance data. Reports significant pressure and mask discomfort. Denies snoring with CPAP therapy.  Reports nocturnal awakenings due to mask issues, however does not have difficulty falling back to sleep. Denies any significant weight changes. Admits to occasional headaches. Denies RLS symptoms, dream enactment, cataplexy, hypnagogic or hypnapompic hallucinations. Reports a family history of sleep apnea. Denies drowsy driving. Drinks 1 cup of coffee daily, denies alcohol, tobacco or illicit drug use.   Bedtime 8 pm Sleep onset 2 mins Rise time 3:30-4 am   PAST MEDICAL HISTORY :   has a past medical history of Arthritis, Diverticulitis, Encounter for immunization (06/03/2023), Grade I diastolic dysfunction, Headache, History of diverticulitis (05/31/2019), History of hydronephrosis (05/31/2019), History of kidney stones, History of nephrolithiasis (05/31/2019), Hydronephrosis (12/12/2012), Hypothyroid (05/30/2019), IBS (irritable bowel syndrome) (05/31/2019), Kidney stones, Mild mitral regurgitation by prior echocardiogram, Neck arthritis (05/31/2019), Nephrolithiasis (05/31/2019), Neuromuscular disorder (HCC), OSA on CPAP, Sleep apnea, Stage 3a chronic kidney disease (CKD) (HCC), Stroke (HCC) (06/10/2023), Thyroid  disease, and Ureteric stone (12/12/2012).  has a past surgical history that includes Hysterectomy abdominal with salpingectomy; Abdominal hysterectomy; Anal fissure repair (1999); Ureteroscopy with holmium laser lithotripsy (Left, 09/28/2019); and Breast  biopsy (Left, 06/2012). Prior to Admission medications   Medication Sig Start Date End Date Taking? Authorizing Provider  aspirin  81 MG chewable tablet Chew 1 tablet (81 mg total) by mouth daily. 11/26/23  Yes Bair, Kalpana, MD  atorvastatin  (LIPITOR) 40 MG tablet Take 1 tablet (40 mg total) by mouth daily. 11/26/23 11/25/24 Yes Bair, Kalpana, MD  Cholecalciferol 25 MCG (1000 UT) tablet Take 1,000 Units by mouth daily.   Yes [provider]  docusate sodium  (COLACE) 100 MG capsule Take 100 mg by mouth daily.   Yes [provider]  fluticasone (FLONASE) 50 MCG/ACT nasal spray Place 2 sprays into both nostrils daily.   Yes [provider]  levothyroxine  (SYNTHROID ) 125 MCG tablet Take 1 tablet (125 mcg total) by mouth daily before breakfast. 11/26/23  Yes Bair, Kalpana, MD  polyethylene glycol powder (GLYCOLAX/MIRALAX) 17 GM/SCOOP powder Take 17 g by mouth.   Yes [provider]   Allergies  Allergen Reactions   Amoxicillin     Other reaction(s): Unknown   Cefprozil Other (See Comments)   Lansoprazole Other (See Comments)    Other reaction(s Intolerance on daily bases   Penicillins Hives    Childhood     FAMILY HISTORY:  family history includes Arthritis in her mother, sister, sister, and sister; Asthma in her sister; Breast cancer in her maternal aunt; Cystic fibrosis in her brother; Depression in her sister and sister; Diabetes in her mother, sister, and sister; Early death in her sister; Hearing loss in her mother; Heart disease in her brother, brother, and father; Hyperlipidemia in her mother, sister, and sister; Hypertension in her mother, sister, sister, sister, sister, and sister; Multiple sclerosis in her sister; Parkinson's disease in her sister; Stroke in her sister and sister; Varicose Veins in her sister. SOCIAL HISTORY:  reports that she has never smoked. She has never used smokeless tobacco. She  reports that she does not currently use alcohol. She  reports that she does not use drugs.   Review of Systems:  Gen:  Denies  fever, sweats, chills weight loss  HEENT: Denies blurred vision, double vision, ear pain, eye pain, hearing loss, nose bleeds, sore throat Cardiac:  No dizziness, chest pain or heaviness, chest tightness,edema, No JVD Resp:   No cough, -sputum production, -shortness of breath,-wheezing, -hemoptysis,  Gi: Denies swallowing difficulty, stomach pain, nausea or vomiting, diarrhea, constipation, bowel incontinence Gu:  Denies bladder incontinence, burning urine Ext:   Denies Joint pain, stiffness or swelling Skin: Denies  skin rash, easy bruising or bleeding or hives Endoc:  Denies polyuria, polydipsia , polyphagia or weight change Psych:   Denies depression, insomnia or hallucinations  Other:  All other systems negative  VITAL SIGNS: BP 110/70   Pulse 60   Temp 97.7 F (36.5 C)   Ht 5' 3 (1.6 m)   Wt 130 lb 12.8 oz (59.3 kg)   SpO2 98%   BMI 23.17 kg/m    Physical Examination:   General Appearance: No distress  EYES PERRLA, EOM intact.   NECK Supple, No JVD Pulmonary: normal breath sounds, No wheezing.  CardiovascularNormal S1,S2.  No m/r/g.   Abdomen: Benign, Soft, non-tender. Skin:   warm, no rashes, no ecchymosis  Extremities: normal, no cyanosis, clubbing. Neuro:without focal findings,  speech normal  PSYCHIATRIC: Mood, affect within normal limits.   ASSESSMENT AND PLAN  OSA Patient is using and benefiting from CPAP therapy. To improve comfort, will decrease max pressure to 8 cm H2O and will try patient on the Airtouch N30i nasal mask. Discussed the consequences of untreated sleep apnea. Advised not to drive drowsy for safety of patient and others. Will follow up in 3 months.    CVA Stable, on current management. Following with PCP.    Patient  satisfied with Plan of action and management. All questions answered  I spent a total of 50 minutes reviewing chart data, face-to-face evaluation  with the patient, counseling and coordination of care as detailed above.    Reilley Latorre, M.D.  Sleep Medicine [facility]

## 2023-12-08 ENCOUNTER — Ambulatory Visit: Payer: Self-pay | Admitting: Anesthesiology

## 2023-12-08 ENCOUNTER — Encounter
Admission: RE | Disposition: A | Discharge status: Home / Self Care | Payer: Self-pay | Source: Home / Self Care | Attending: Podiatry

## 2023-12-08 ENCOUNTER — Other Ambulatory Visit: Payer: Self-pay

## 2023-12-08 ENCOUNTER — Ambulatory Visit
Admission: RE | Admit: 2023-12-08 | Discharge: 2023-12-08 | Disposition: A | Discharge status: Home / Self Care | Attending: Podiatry | Admitting: Podiatry

## 2023-12-08 ENCOUNTER — Ambulatory Visit: Payer: Self-pay

## 2023-12-08 ENCOUNTER — Encounter: Payer: Self-pay | Admitting: Podiatry

## 2023-12-08 DIAGNOSIS — I639 Cerebral infarction, unspecified: Secondary | ICD-10-CM | POA: Diagnosis not present

## 2023-12-08 DIAGNOSIS — Z8673 Personal history of transient ischemic attack (TIA), and cerebral infarction without residual deficits: Secondary | ICD-10-CM | POA: Diagnosis not present

## 2023-12-08 DIAGNOSIS — G4730 Sleep apnea, unspecified: Secondary | ICD-10-CM | POA: Diagnosis not present

## 2023-12-08 DIAGNOSIS — E785 Hyperlipidemia, unspecified: Secondary | ICD-10-CM | POA: Diagnosis not present

## 2023-12-08 DIAGNOSIS — M2021 Hallux rigidus, right foot: Secondary | ICD-10-CM | POA: Diagnosis not present

## 2023-12-08 DIAGNOSIS — G4733 Obstructive sleep apnea (adult) (pediatric): Secondary | ICD-10-CM | POA: Diagnosis not present

## 2023-12-08 HISTORY — DX: Obstructive sleep apnea (adult) (pediatric): G47.33

## 2023-12-08 HISTORY — DX: Nonrheumatic mitral (valve) insufficiency: I34.0

## 2023-12-08 HISTORY — DX: Chronic kidney disease, stage 3a: N18.31

## 2023-12-08 HISTORY — DX: Headache, unspecified: R51.9

## 2023-12-08 HISTORY — DX: Other ill-defined heart diseases: I51.89

## 2023-12-08 HISTORY — DX: Myoneural disorder, unspecified: G70.9

## 2023-12-08 HISTORY — DX: Sleep apnea, unspecified: G47.30

## 2023-12-08 HISTORY — DX: Personal history of urinary calculi: Z87.442

## 2023-12-08 SURGERY — FUSION, JOINT, FOOT
Anesthesia: Choice | Site: Foot | Laterality: Right

## 2023-12-08 MED ORDER — ONDANSETRON HCL 4 MG/2ML IJ SOLN: 4.0000 mg | Freq: Four times a day (QID) | INTRAMUSCULAR | Status: DC | PRN

## 2023-12-08 MED ORDER — FENTANYL CITRATE (PF) 100 MCG/2ML IJ SOLN
INTRAMUSCULAR | Status: DC | PRN
  Administered 2023-12-08: 50 ug via INTRAVENOUS

## 2023-12-08 MED ORDER — PROPOFOL 10 MG/ML IV BOLUS
INTRAVENOUS | Status: DC | PRN
Start: 2023-12-08 — End: 2023-12-08
  Administered 2023-12-08: 150 mg via INTRAVENOUS

## 2023-12-08 MED ORDER — FENTANYL CITRATE (PF) 100 MCG/2ML IJ SOLN
INTRAMUSCULAR | Status: AC
  Filled 2023-12-08: qty 2

## 2023-12-08 MED ORDER — LIDOCAINE HCL (CARDIAC) PF 100 MG/5ML IV SOSY
PREFILLED_SYRINGE | INTRAVENOUS | Status: DC | PRN
  Administered 2023-12-08: 40 mg via INTRATRACHEAL

## 2023-12-08 MED ORDER — LACTATED RINGERS IV SOLN: INTRAVENOUS | Status: DC

## 2023-12-08 MED ORDER — BUPIVACAINE LIPOSOME 1.3 % IJ SUSP
INTRAMUSCULAR | Status: DC | PRN
  Administered 2023-12-08: 10 mL

## 2023-12-08 MED ORDER — EPHEDRINE SULFATE (PRESSORS) 25 MG/5ML IV SOSY
PREFILLED_SYRINGE | INTRAVENOUS | Status: DC | PRN
  Administered 2023-12-08 (×4): 10 mg via INTRAVENOUS

## 2023-12-08 MED ORDER — ONDANSETRON HCL 4 MG/2ML IJ SOLN
INTRAMUSCULAR | Status: DC | PRN
  Administered 2023-12-08: 4 mg via INTRAVENOUS

## 2023-12-08 MED ORDER — PHENYLEPHRINE HCL (PRESSORS) 10 MG/ML IV SOLN
INTRAVENOUS | Status: DC | PRN
  Administered 2023-12-08 (×6): 80 ug via INTRAVENOUS

## 2023-12-08 MED ORDER — MIDAZOLAM HCL 2 MG/2ML IJ SOLN
INTRAMUSCULAR | Status: AC
  Filled 2023-12-08: qty 2

## 2023-12-08 MED ORDER — MIDAZOLAM HCL 5 MG/5ML IJ SOLN
INTRAMUSCULAR | Status: DC | PRN
  Administered 2023-12-08: 2 mg via INTRAVENOUS

## 2023-12-08 MED ORDER — METOCLOPRAMIDE HCL 5 MG PO TABS: 5.0000 mg | ORAL_TABLET | Freq: Three times a day (TID) | ORAL | Status: DC | PRN

## 2023-12-08 MED ORDER — BUPIVACAINE HCL (PF) 0.25 % IJ SOLN
INTRAMUSCULAR | Status: DC | PRN
  Administered 2023-12-08: 10 mL

## 2023-12-08 MED ORDER — CEFAZOLIN SODIUM-DEXTROSE 2-3 GM-%(50ML) IV SOLR
INTRAVENOUS | Status: AC
  Filled 2023-12-08: qty 50

## 2023-12-08 MED ORDER — METOCLOPRAMIDE HCL 5 MG/ML IJ SOLN: 5.0000 mg | Freq: Three times a day (TID) | INTRAMUSCULAR | Status: DC | PRN

## 2023-12-08 MED ORDER — SODIUM CHLORIDE 0.9 % IV SOLN: INTRAVENOUS | Status: DC

## 2023-12-08 MED ORDER — BUPIVACAINE LIPOSOME 1.3 % IJ SUSP
INTRAMUSCULAR | Status: AC
  Filled 2023-12-08: qty 10

## 2023-12-08 MED ORDER — CEFAZOLIN SODIUM-DEXTROSE 2-4 GM/100ML-% IV SOLN
2.0000 g | INTRAVENOUS | Status: AC
  Administered 2023-12-08: 2 g via INTRAVENOUS

## 2023-12-08 MED ORDER — OXYCODONE-ACETAMINOPHEN 5-325 MG PO TABS
1.0000 | ORAL_TABLET | Freq: Four times a day (QID) | ORAL | 0 refills | Status: AC | PRN
Start: 2023-12-08 — End: ?

## 2023-12-08 MED ORDER — ACETAMINOPHEN 10 MG/ML IV SOLN
INTRAVENOUS | Status: AC
  Filled 2023-12-08: qty 100

## 2023-12-08 MED ORDER — ONDANSETRON HCL 4 MG PO TABS
4.0000 mg | ORAL_TABLET | Freq: Four times a day (QID) | ORAL | Status: DC | PRN
Start: 2023-12-08 — End: 2023-12-08

## 2023-12-08 MED ORDER — SODIUM CHLORIDE 0.9 % IR SOLN
Status: DC | PRN
  Administered 2023-12-08: 500 mL

## 2023-12-08 SURGICAL SUPPLY — 28 items
BENZOIN TINCTURE PRP APPL 2/3 (GAUZE/BANDAGES/DRESSINGS) ×1 IMPLANT
BLADE OSC/SAGITTAL MD 9X18.5 (BLADE) IMPLANT
BNDG COHESIVE 4X5 TAN STRL LF (GAUZE/BANDAGES/DRESSINGS) ×1 IMPLANT
BNDG ELASTIC 4X5.8 VLCR NS LF (GAUZE/BANDAGES/DRESSINGS) ×1 IMPLANT
BNDG ESMARCH 4X12 STRL LF (GAUZE/BANDAGES/DRESSINGS) ×1 IMPLANT
BNDG GAUZE DERMACEA FLUFF 4 (GAUZE/BANDAGES/DRESSINGS) ×1 IMPLANT
BNDG STRETCH 4X75 STRL LF (GAUZE/BANDAGES/DRESSINGS) ×1 IMPLANT
CANISTER SUCT 1200ML W/VALVE (MISCELLANEOUS) ×1 IMPLANT
COVER LIGHT HANDLE UNIVERSAL (MISCELLANEOUS) ×2 IMPLANT
DRAPE FLUOR MINI C-ARM 54X84 (DRAPES) ×1 IMPLANT
DURAPREP 26ML APPLICATOR (WOUND CARE) ×1 IMPLANT
ELECTRODE REM PT RTRN 9FT ADLT (ELECTROSURGICAL) ×1 IMPLANT
GAUZE SPONGE 4X4 12PLY STRL (GAUZE/BANDAGES/DRESSINGS) ×1 IMPLANT
GAUZE XEROFORM 1X8 LF (GAUZE/BANDAGES/DRESSINGS) ×1 IMPLANT
GLOVE BIOGEL PI IND STRL 8 (GLOVE) ×2 IMPLANT
GLOVE SURG SS PI 7.5 STRL IVOR (GLOVE) ×2 IMPLANT
GOWN SPEC L4 XLG W/TWL (GOWN DISPOSABLE) ×1 IMPLANT
GOWN STRL REUS W/ TWL LRG LVL3 (GOWN DISPOSABLE) ×1 IMPLANT
KIT TURNOVER KIT A (KITS) ×1 IMPLANT
NS IRRIG 500ML POUR BTL (IV SOLUTION) ×1 IMPLANT
PACK EXTREMITY ARMC (MISCELLANEOUS) ×1 IMPLANT
PENCIL SMOKE EVACUATOR (MISCELLANEOUS) ×1 IMPLANT
RASP SM TEAR CROSS CUT (RASP) IMPLANT
STOCKINETTE IMPERVIOUS LG (DRAPES) ×1 IMPLANT
STRIP CLOSURE SKIN 1/4X4 (GAUZE/BANDAGES/DRESSINGS) ×1 IMPLANT
SUT VIC AB 3-0 SH 27X BRD (SUTURE) IMPLANT
SUT VIC AB 4-0 SH 27XANBCTRL (SUTURE) IMPLANT
SUTURE MNCRL 4-0 27XMF (SUTURE) ×1 IMPLANT

## 2023-12-08 NOTE — Discharge Instructions (Signed)

## 2023-12-08 NOTE — Op Note (Signed)
 Operative note   Surgeon:Jaryn Hocutt [employer]/support/administrative Officer: None    Preop diagnosis: Hallux rigidus right first MTPJ    Postop diagnosis: Same    Procedure: 1.  Cheilectomy right first MTPJ 2.  Intraoperative fluoroscopy use without assistance of radiologist    EBL: Minimal    Anesthesia: General With local.  Local consists of a total of 20 cc of one-to-one mixture of 0.25% bupivacaine plain and Exparel long-acting anesthetic.    Hemostasis: Mid calf tourniquet inflated to 200 mmHg for approximately 30 minutes    Specimen: None    Complications: None    Operative indications:Kathleen Sims is an 63 y.o. that presents today for surgical intervention.  The risks/benefits/alternatives/complications have been discussed and consent has been given.    Procedure:  Patient was brought into the OR and placed on the operating table in thesupine position. After anesthesia was obtained theright lower extremity was prepped and draped in usual sterile fashion.  Attention was directed to the right first MTPJ where a dorsal medial incision was performed.  Sharp and blunt dissection carried down to the capsule.  A longitudinal capsulotomy was then performed.  The head of the metatarsal and base of the proximal phalanx were then exposed.  The dorsal one third of the metatarsal head was noted to have minimal cartilage.  With a power saw I was able to remove the of the metatarsophalangeal joint and all periarticular spurring was removed.  The head of the metatarsal was contoured with a power rasp and taken down to help the was also remodeled removing any particular under bone down to good cartilaginous material.  With copious amounts of irrigation after consistent remodeling and removal of any abnormal bone and prominences.  Good range of motion was noted intraoperatively after removal of all the bone and spurring.  Flushed with copious amounts of irrigation.  Fluoroscopy was used to confirm removal of abnormal bone  and spurring.  At this time after final flush the closure was performed with 3-0 Vicryl the subcutaneous and capsular tissue and a 4-0 Vicryl the subcutaneous tissue with a 4-0 Monocryl for the skin.  A bulky sterile dressing was applied.    Patient tolerated the procedure and anesthesia well.  Was transported from the OR to the PACU with all vital signs stable and vascular status intact. To be discharged per routine protocol.  Will follow up in approximately 1 week in the outpatient clinic.

## 2023-12-08 NOTE — H&P (Signed)
 HISTORY AND PHYSICAL INTERVAL NOTE:  12/08/2023  1:25 PM  Kathleen Sims  has presented today for surgery, with the diagnosis of Hallux rigidus of right foot M20.21.  The various methods of treatment have been discussed with the patient.  No guarantees were given.  After consideration of risks, benefits and other options for treatment, the patient has consented to surgery.  I have reviewed the patients' chart and labs.     A history and physical examination was performed in my office.  The patient was reexamined.  There have been no changes to this history and physical examination.  Ashley Soulier A

## 2023-12-08 NOTE — Transfer of Care (Signed)
 Immediate Anesthesia Transfer of Care Note  Patient: Kathleen Sims  Procedure(s) Performed: FUSION, JOINT, FOOT (Right: Foot)  Patient Location: PACU  Anesthesia Type: No value filed.  Level of Consciousness: awake, alert  and patient cooperative  Airway and Oxygen Therapy: Patient Spontanous Breathing and Patient connected to supplemental oxygen  Post-op Assessment: Post-op Vital signs reviewed, Patient's Cardiovascular Status Stable, Respiratory Function Stable, Patent Airway and No signs of Nausea or vomiting  Post-op Vital Signs: Reviewed and stable  Complications: No notable events documented.

## 2023-12-08 NOTE — Anesthesia Procedure Notes (Signed)
 Procedure Name: LMA Insertion Date/Time: 12/08/2023 1:43 PM  Performed by: Levy Harvey, CRNAPre-anesthesia Checklist: Patient identified, Emergency Drugs available, Suction available, Timeout performed and Patient being monitored Patient Re-evaluated:Patient Re-evaluated prior to induction Oxygen Delivery Method: Circle system utilized Preoxygenation: Pre-oxygenation with 100% oxygen Induction Type: IV induction LMA: LMA inserted LMA Size: 4.0 Number of attempts: 1 Placement Confirmation: positive ETCO2 and breath sounds checked- equal and bilateral Tube secured with: Tape

## 2023-12-08 NOTE — Anesthesia Postprocedure Evaluation (Signed)
 Anesthesia Post Note  Patient: Kathleen Sims  Procedure(s) Performed: FUSION, JOINT, FOOT (Right: Foot)  Patient location during evaluation: PACU Anesthesia Type: MAC Level of consciousness: awake and alert Pain management: pain level controlled Vital Signs Assessment: post-procedure vital signs reviewed and stable Respiratory status: spontaneous breathing, nonlabored ventilation, respiratory function stable and patient connected to nasal cannula oxygen Cardiovascular status: stable and blood pressure returned to baseline Postop Assessment: no apparent nausea or vomiting Anesthetic complications: no   No notable events documented.   Last Vitals:  Vitals:   12/08/23 1439 12/08/23 1456  BP: 111/64   Pulse: 75   Resp: 20   Temp: (!) 36.3 C 36.6 C  SpO2: 96%     Last Pain:  Vitals:   12/08/23 1456  TempSrc:   PainSc: 0-No pain                 Parish Augustine C Liona Wengert

## 2023-12-15 DIAGNOSIS — M2021 Hallux rigidus, right foot: Secondary | ICD-10-CM | POA: Diagnosis not present

## 2023-12-22 DIAGNOSIS — G4733 Obstructive sleep apnea (adult) (pediatric): Secondary | ICD-10-CM | POA: Diagnosis not present

## 2024-03-10 ENCOUNTER — Ambulatory Visit: Admitting: Sleep Medicine

## 2024-05-26 ENCOUNTER — Ambulatory Visit
